# Patient Record
Sex: Male | Born: 1990 | Race: White | Hispanic: No | Marital: Married | Smoking: Never smoker
Health system: Southern US, Community
[De-identification: ages and names within clinical notes are randomized; demographics above are authoritative.]

---
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
## 2011-02-15 ENCOUNTER — Other Ambulatory Visit: Payer: Self-pay | Admitting: *Deleted

## 2011-02-15 DIAGNOSIS — I10 Essential (primary) hypertension: Secondary | ICD-10-CM

## 2011-02-15 DIAGNOSIS — E1065 Type 1 diabetes mellitus with hyperglycemia: Secondary | ICD-10-CM

## 2011-02-15 MED ORDER — LISINOPRIL 5 MG PO TABS: 5.0000 mg | ORAL_TABLET | Freq: Every day | ORAL | Status: DC

## 2011-03-02 ENCOUNTER — Ambulatory Visit: Payer: BC Managed Care – PPO | Admitting: Family Medicine

## 2011-03-04 ENCOUNTER — Ambulatory Visit (HOSPITAL_BASED_OUTPATIENT_CLINIC_OR_DEPARTMENT_OTHER)
Admission: RE | Admit: 2011-03-04 | Discharge: 2011-03-04 | Disposition: A | Discharge status: Home / Self Care | Payer: BC Managed Care – PPO | Source: Ambulatory Visit | Attending: Family Medicine | Admitting: Family Medicine

## 2011-03-04 ENCOUNTER — Encounter: Payer: Self-pay | Admitting: Family Medicine

## 2011-03-04 ENCOUNTER — Ambulatory Visit (INDEPENDENT_AMBULATORY_CARE_PROVIDER_SITE_OTHER): Payer: BC Managed Care – PPO | Admitting: Family Medicine

## 2011-03-04 VITALS — BP 123/74 | HR 55 | Temp 98.1°F | Ht 74.0 in | Wt 182.0 lb

## 2011-03-04 DIAGNOSIS — M545 Low back pain, unspecified: Secondary | ICD-10-CM | POA: Insufficient documentation

## 2011-03-04 DIAGNOSIS — M47817 Spondylosis without myelopathy or radiculopathy, lumbosacral region: Secondary | ICD-10-CM | POA: Insufficient documentation

## 2011-03-04 DIAGNOSIS — M519 Unspecified thoracic, thoracolumbar and lumbosacral intervertebral disc disorder: Secondary | ICD-10-CM

## 2011-03-04 NOTE — Progress Notes (Signed)
Subjective:    Patient ID: Bobby Padilla, male    DOB: 1991-06-24, 20 y.o.   MRN: [id number]  HPI  20 yo M here for low back pain.  Patient has h/o bilateral pars defects diagnosed 2 years ago - had symptoms and signs consistent with acute exacerbation of pain in this area, x-rays confirmed defects but no evidence of anterolisthesis. Had been dealing with the pain for several months after unsuccessful PT for lumbar strain, chiropractic care. He recovered fully after 3 months of rest following pars diagnosis and returned without problems. At that initial visit he had a positive stork test, pain with hip extension on left side but not on right. On 5/20 while jumping up for a header during a soccer game, was pushed backwards and fell on ground. Unsure at which point back started hurting but developed pain in right paraspinal lumbar region. Had to come out of game at that time. Tried to play the next day but had to be pulled after 10 minutes due to pain. No swelling or bruising. Has been able to practice this week without problems but has not tried kicking ball hard. Had pain with quick movements right after injury but with practice this week has not had problems. Playing [employer] level soccer at this time. Has 2 games this coming weekend, 2 the following weekend, then playoffs.  Past Medical History  Diagnosis Date  . Diabetes mellitus     Current Outpatient Prescriptions on File Prior to Visit  Medication Sig Dispense Refill  . lisinopril (PRINIVIL,ZESTRIL) 5 MG tablet Take 1 tablet (5 mg total) by mouth daily.  30 tablet  11    History reviewed. No pertinent past surgical history.  No Known Allergies  History   Social History  . Marital Status: Single    Spouse Name: N/A    Number of Children: N/A  . Years of Education: N/A   Occupational History  . Not on file.   Social History Main Topics  . Smoking status: Never Smoker   . Smokeless tobacco: Not on file  . Alcohol  Use: Not on file  . Drug Use: Not on file  . Sexually Active: Not on file   Other Topics Concern  . Not on file   Social History Narrative  . No narrative on file    Family History  Problem Relation Age of Onset  . Diabetes Maternal Aunt   . Diabetes Maternal Uncle   . Heart attack Neg Hx     BP 123/74  Pulse 55  Temp(Src) 98.1 F (36.7 C) (Oral)  Ht 6\' 2"  (1.88 m)  Wt 182 lb (82.555 kg)  BMI 23.37 kg/m2  Review of Systems See HPI above.    Objective:   Physical Exam  Gen: NAD Back: No gross deformity, swelling, bruising, scoliosis. No focal midline or paraspinal lumbar TTP.  No SI joint, trochanter, piriformis TTP. FROM without pain on full flexion or extension.  No pain with lateral rotation. Negative stork tests bilaterally. Strength 5/5 BLEs Sensation intact to light touch bilaterally Negative SLRs. Negative logroll bilateral hips.     Assessment & Plan:  1. Low back pain - differential lumbar strain vs exacerbation of pars defects.  Has had significant improvement in symptoms over past 10 days and able to play soccer past couple days without problems.  Negative stork tests and no pain on extension on exam.  Compared lateral radiographs to those done in February 2010 and these appear completely unchanged  with respect to anterolisthesis - discussed regular follow-up until complete skeletal maturity (yearly) but he is at low risk of this progressing at this point.  Bone scan could definitively rule out acute on chronic pars injury but do not feel this is necessary at this time given his marked improvement over past 10 days and exam negative for signs of pars injury.  Regardless, advised he not play in soccer games this weekend to allow for more rest.  Flexion based activities only, handout provided with stretches and exercises.  Aleve, icing as needed.  If has acute worsening of symptoms, would then proceed with bone scan.  F/u in 1 month for reevaluation or sooner if  he has problems.

## 2011-03-04 NOTE — Patient Instructions (Addendum)
Your exam is more consistent with a lumbar strain than an exacerbation of your pars defect. I would recommend that you stay out of game activities for this weekend then resume normal practices on Tuesday. Try to avoid extension of your back as much as possible if this is painful. Avoid painful activities as much as possible. Crunches, curl-ups, bicycles 3 sets of 15 once a day. Knee to chest stretch - hold for 3 x 20-30 seconds and do once a day. Aleve 1-2 tabs twice a day with food for pain. Ice low back 15 minutes at a time 3-4 times a day as needed for pain. Soft brace is ok to wear though studies have not shown them to make much of a difference. Follow up with me in 1 month to recheck on your progress.

## 2011-03-22 ENCOUNTER — Encounter: Payer: Self-pay | Admitting: Pediatrics

## 2011-03-22 DIAGNOSIS — E038 Other specified hypothyroidism: Secondary | ICD-10-CM

## 2011-03-22 DIAGNOSIS — E1065 Type 1 diabetes mellitus with hyperglycemia: Secondary | ICD-10-CM

## 2011-03-22 DIAGNOSIS — E039 Hypothyroidism, unspecified: Secondary | ICD-10-CM | POA: Insufficient documentation

## 2011-05-13 ENCOUNTER — Ambulatory Visit: Payer: BC Managed Care – PPO | Admitting: "Endocrinology

## 2011-09-30 ENCOUNTER — Other Ambulatory Visit: Payer: Self-pay | Admitting: "Endocrinology

## 2011-10-04 ENCOUNTER — Telehealth: Payer: Self-pay | Admitting: *Deleted

## 2011-10-04 NOTE — Telephone Encounter (Signed)
See below note.

## 2011-10-07 ENCOUNTER — Ambulatory Visit: Payer: BC Managed Care – PPO | Admitting: "Endocrinology

## 2011-10-14 ENCOUNTER — Other Ambulatory Visit: Payer: Self-pay | Admitting: "Endocrinology

## 2011-11-12 ENCOUNTER — Other Ambulatory Visit: Payer: Self-pay | Admitting: *Deleted

## 2011-12-20 ENCOUNTER — Ambulatory Visit: Payer: BC Managed Care – PPO | Admitting: "Endocrinology

## 2012-02-17 ENCOUNTER — Ambulatory Visit: Payer: BC Managed Care – PPO | Admitting: "Endocrinology

## 2012-03-02 ENCOUNTER — Encounter: Payer: Self-pay | Admitting: "Endocrinology

## 2012-03-02 ENCOUNTER — Ambulatory Visit (INDEPENDENT_AMBULATORY_CARE_PROVIDER_SITE_OTHER): Payer: BC Managed Care – PPO | Admitting: "Endocrinology

## 2012-03-02 VITALS — BP 140/75 | HR 61 | Wt 185.6 lb

## 2012-03-02 DIAGNOSIS — E1169 Type 2 diabetes mellitus with other specified complication: Secondary | ICD-10-CM

## 2012-03-02 DIAGNOSIS — I10 Essential (primary) hypertension: Secondary | ICD-10-CM

## 2012-03-02 DIAGNOSIS — E049 Nontoxic goiter, unspecified: Secondary | ICD-10-CM

## 2012-03-02 DIAGNOSIS — IMO0002 Reserved for concepts with insufficient information to code with codable children: Secondary | ICD-10-CM

## 2012-03-02 DIAGNOSIS — E1065 Type 1 diabetes mellitus with hyperglycemia: Secondary | ICD-10-CM

## 2012-03-02 DIAGNOSIS — E11649 Type 2 diabetes mellitus with hypoglycemia without coma: Secondary | ICD-10-CM

## 2012-03-02 DIAGNOSIS — E063 Autoimmune thyroiditis: Secondary | ICD-10-CM

## 2012-03-02 LAB — LIPID PANEL
Cholesterol: 157 mg/dL (ref 0–200)
Total CHOL/HDL Ratio: 2.8 Ratio
Triglycerides: 53 mg/dL (ref <150)
VLDL: 11 mg/dL (ref 0–40)

## 2012-03-02 LAB — COMPREHENSIVE METABOLIC PANEL
ALT: 16 U/L (ref 0–53)
AST: 16 U/L (ref 0–37)
Alkaline Phosphatase: 81 U/L (ref 39–117)
BUN: 16 mg/dL (ref 6–23)
CO2: 29 mEq/L (ref 19–32)
Calcium: 9.8 mg/dL (ref 8.4–10.5)
Chloride: 100 mEq/L (ref 96–112)
Glucose, Bld: 176 mg/dL — ABNORMAL HIGH (ref 70–99)
Total Protein: 6.9 g/dL (ref 6.0–8.3)

## 2012-03-02 LAB — TSH: TSH: 1.124 u[IU]/mL (ref 0.350–4.500)

## 2012-03-02 LAB — T3, FREE: T3, Free: 3.1 pg/mL (ref 2.3–4.2)

## 2012-03-02 LAB — T4, FREE: Free T4: 1.13 ng/dL (ref 0.80–1.80)

## 2012-03-02 MED ORDER — LISINOPRIL 5 MG PO TABS: 5.0000 mg | ORAL_TABLET | Freq: Every day | ORAL | Status: DC

## 2012-03-02 NOTE — Progress Notes (Signed)
Subjective:  Patient Name: Bobby Padilla Date of Birth: [birth year]  MRN: [id number]  Bobby Padilla  presents to the office today for follow-up of his T1DM,hypoglycemia, goiter, thyroiditis, intermittent hypothyroidism, and hypertension.  HISTORY OF PRESENT ILLNESS:   Bobby Padilla is a 21 y.o. Caucasian young man.  Bobby Padilla was accompanied by his mother.   1. Bobby Padilla was diagnosed with T1DM in July 2004. He had typical polyuria, polydipsia, and weight loss. He was hospitalized at [facility] ([place]). Serum glucose was reportedly 600. He was diagnosed with "borderline DKA". A multiple daily injection (MD) insulin regimen was initiated. He was subsequently converted to an insulin pump about one year later. He was followed at [facility] diabetes [facility] until his first visit with me on 4.06.06. At that visit he was growing well. I noted a goiter, but his TFTs showed that he was mid-range euthyroid. During the past seven years the patient has done well overall, with HbA1c values varying from 7.2-9.7%. Most HbA1c values have been between 7.8-8.6%. He was mildly hypothyroid in 2007, but since it appeared that he was likely having a flare-up of Hashimoto's disease, I chose not to treat him with Synthroid. He has remained euthyroid, without Synthroid treatment, since then. In 2011 I started him on low-dose lisinopril for both mild hypertension and renal protection.  2. The patient's last PSSG visit was on 12/22/10. In the interim, he has been healthy. He discontinued his insulin pump about one year ago due to playing soccer in college. He prefers to remain on injections. His current Lantus dose is 40 units in the evening. He also takes Novolog aspart insulin by Flexpens. His ISF is 1:30 >150. His food dose is 1:8. He sometimes loosely follows his bedtime snack plan. He discontinued his lisinopril about one year ago.   3. Pertinent Review of Systems:  Constitutional: The patient feels "good". He has no  significant complaints. Eyes: Vision is good. There are no significant eye complaints. His last eye was in the spring of 2012. Neck: The patient has no complaints of anterior neck swelling, soreness, tenderness,  pressure, discomfort, or difficulty swallowing.  Heart: Heart rate increases with exercise or other physical activity. The patient has no complaints of palpitations, irregular heat beats, chest pain, or chest pressure. Gastrointestinal: Bowel movents seem normal. The patient has no complaints of excessive hunger, acid reflux, upset stomach, stomach aches or pains, diarrhea, or constipation. Hands: Can text and do video games quite well.  Legs: Muscle mass and strength seem normal. There are no complaints of numbness, tingling, burning, or pain. No edema is noted. Feet: There are no obvious foot problems. There are no complaints of numbness, tingling, burning, or pain. No edema is noted. Hypoglycemia: Occasionally wakes up in the middle of the night with low BGs. He has not routinely been subtracting points of BG after physical activity.  4. BG printout: BGs vary from 47-497. The lowest BG occurred during physical yard work. Since coming from college at the beginning of May he has had a less structured schedule. He missed 4 bedtime BG checks in the past 14 days.   PAST MEDICAL, FAMILY, AND SOCIAL HISTORY:  Past Medical History  Diagnosis Date  . Diabetes mellitus     Family History  Problem Relation Age of Onset  . Diabetes Maternal Aunt   . Diabetes Maternal Uncle   . Heart attack Neg Hx       Allergies as of 03/02/2012  . (No Known Allergies)  1. School, work, and family: He just finished his freshman year at [employer]. 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 Dad's renal transplant has gone well.  2. Activities: He is no longer playing varsity soccer, but does play with the club team at school. He also plays pick-up basketball a lot. 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 3. Smoking, alcohol, or drugs: None 4. Primary Care Provider: Student health.  ROS: There are no other significant problems involving Bobby Padilla's other body systems.   Objective:  Vital Signs:  BP 140/75  Pulse 61  Wt 185 lb 9.6 oz (84.188 kg)   Ht Readings from Last 3 Encounters:  03/04/11 6\' 2"  (1.88 m) (94.91%*)  01/16/09 6' (1.829 m) (88.17%*)   * Growth percentiles are based on CDC 2-20 Years data.  Weight: 185 lbs, 9.6 oz  PHYSICAL EXAM:  Constitutional: The patient appears healthy and well nourished.  Face: The face appears normal.  Eyes: There is no obvious arcus or proptosis. Moisture appears normal. Mouth: The oropharynx and tongue appear normal. Oral moisture is normal. Neck: The neck appears to be visibly normal. No carotid bruits are noted. The thyroid gland is 20+ grams in size. The consistency of the thyroid gland is  normal. The thyroid gland is not tender to palpation. Lungs: The lungs are clear to auscultation. Air movement is good. Heart: Heart rate and rhythm are regular. Heart sounds S1 and S2 are normal. I did not appreciate any pathologic cardiac murmurs. Abdomen: The abdomen is normal in size. Bowel sounds are normal. There is no obvious hepatomegaly, splenomegaly, or other mass effect.  Arms: Muscle size and bulk are normal for age. Hands: There is no obvious tremor. Phalangeal and metacarpophalangeal joints are normal. Palmar muscles are normal. Palmar skin is normal. Palmar moisture is also normal. Legs: Muscles appear normal for age. No edema is present. Feet: Feet are normally formed. Dorsalis pedal pulses are faint 1+. PT pulses are 2+. Neurologic: Strength is normal for age in both the upper and lower extremities. Muscle tone is normal. Sensation to touch is normal in both the legs and feet.   LAB DATA: HbA1c is 7/1%, compared with 7.8% in March 2012.  Recent Results (from the past 504 hour(s))  GLUCOSE, POCT  (MANUAL RESULT ENTRY)   Collection Time   03/02/12 10:02 AM      Component Value Range   POC Glucose 238 (*) 70 - 99 (mg/dl)     Assessment and Plan:   ASSESSMENT:  1. T1DM: The patient is doing better overall, but still has more BG variability than desirable.  2. Hypoglycemia: The nocturnal events are likely due to combination of not checking BG at HS or taking enough HS snack and not subtracting 50-100 points of BG after physical activity during the evening.  3. Hypertension: Patient must resume lisinopril and adjust doses upward as needed.  4. Goiter: His thyroid gland remains mildly enlarged. He has been euthyroid most of the time in the past. 5. Thyroiditis: His thyroiditis is clinically quiescent.   PLAN:  1. Diagnostic: CMP, TFTS, TPO lipid panel, urine protein test 2. Therapeutic: Check BG at HS. Take appropriate HS snack or sliding scale Novolog dose. Will resume lisinopril, 5 mg per day. 3. Patient education: We discussed hypertensive kidney disease, thyroiditis and hypothyroidism, and long-term complications of poorly controlled T1DM. 4. Follow-up: 6 months  Level of Service: This visit lasted in excess  of 40 minutes. More than 50% of the visit was devoted to counseling.  David Stall, MD 03/02/2012 10:07 AM

## 2012-03-02 NOTE — Patient Instructions (Signed)
Followup visit in 6 months, or 3-4 months if available. Please check blood sugars at bedtime. Please take appropriate bedtime snack or NovoLog dose by sliding scale at bedtime.

## 2012-03-03 LAB — THYROID PEROXIDASE ANTIBODY: Thyroperoxidase Ab SerPl-aCnc: 13.9 IU/mL (ref <35.0)

## 2012-03-03 LAB — MICROALBUMIN / CREATININE URINE RATIO
Creatinine, Urine: 209.4 mg/dL
Microalb Creat Ratio: 10.1 mg/g (ref 0.0–30.0)
Microalb, Ur: 2.12 mg/dL — ABNORMAL HIGH (ref 0.00–1.89)

## 2012-05-10 ENCOUNTER — Other Ambulatory Visit: Payer: Self-pay | Admitting: "Endocrinology

## 2012-05-11 ENCOUNTER — Other Ambulatory Visit: Payer: Self-pay | Admitting: "Endocrinology

## 2012-08-14 ENCOUNTER — Other Ambulatory Visit: Payer: Self-pay | Admitting: *Deleted

## 2012-08-14 DIAGNOSIS — E1065 Type 1 diabetes mellitus with hyperglycemia: Secondary | ICD-10-CM

## 2012-08-14 DIAGNOSIS — IMO0002 Reserved for concepts with insufficient information to code with codable children: Secondary | ICD-10-CM

## 2012-09-19 ENCOUNTER — Encounter: Payer: Self-pay | Admitting: "Endocrinology

## 2012-09-19 ENCOUNTER — Ambulatory Visit (INDEPENDENT_AMBULATORY_CARE_PROVIDER_SITE_OTHER): Payer: BC Managed Care – PPO | Admitting: "Endocrinology

## 2012-09-19 VITALS — BP 117/64 | HR 72 | Wt 186.3 lb

## 2012-09-19 DIAGNOSIS — E049 Nontoxic goiter, unspecified: Secondary | ICD-10-CM | POA: Insufficient documentation

## 2012-09-19 DIAGNOSIS — E11649 Type 2 diabetes mellitus with hypoglycemia without coma: Secondary | ICD-10-CM

## 2012-09-19 DIAGNOSIS — I10 Essential (primary) hypertension: Secondary | ICD-10-CM

## 2012-09-19 DIAGNOSIS — E1169 Type 2 diabetes mellitus with other specified complication: Secondary | ICD-10-CM

## 2012-09-19 DIAGNOSIS — E063 Autoimmune thyroiditis: Secondary | ICD-10-CM

## 2012-09-19 DIAGNOSIS — Z23 Encounter for immunization: Secondary | ICD-10-CM

## 2012-09-19 LAB — COMPREHENSIVE METABOLIC PANEL
AST: 25 U/L (ref 0–37)
Albumin: 4.5 g/dL (ref 3.5–5.2)
BUN: 17 mg/dL (ref 6–23)
CO2: 27 mEq/L (ref 19–32)
Potassium: 4.3 mEq/L (ref 3.5–5.3)

## 2012-09-19 LAB — TSH: TSH: 1.634 u[IU]/mL (ref 0.350–4.500)

## 2012-09-19 LAB — LIPID PANEL
HDL: 55 mg/dL (ref >39)
Total CHOL/HDL Ratio: 2.4 Ratio
Triglycerides: 52 mg/dL (ref <150)
VLDL: 10 mg/dL (ref 0–40)

## 2012-09-19 LAB — T4, FREE: Free T4: 1.4 ng/dL (ref 0.80–1.80)

## 2012-09-19 NOTE — Progress Notes (Signed)
Subjective:  Patient Name: Bobby Padilla Date of Birth: 1991-03-11  MRN: [id number]  Bobby Padilla  presents to the office today for follow-up of his T1DM,hypoglycemia, goiter, thyroiditis, intermittent hypothyroidism, and hypertension.  HISTORY OF PRESENT ILLNESS:   Bobby Padilla is a 21 y.o. Caucasian young man.  Bobby Padilla was accompanied by his mother.   1. Bobby Padilla was diagnosed with T1DM in July 2004. He had typical polyuria, polydipsia, and weight loss. He was hospitalized at [facility]. Serum glucose was reportedly 600. He was diagnosed with "borderline DKA". A multiple daily injection (MD) insulin regimen was initiated. He was subsequently converted to an insulin pump about one year later. He was followed at [facility] diabetes clinic until his first visit with me on 01/07/05. At that visit he was growing well. I noted a goiter, but his TFTs showed that he was mid-range euthyroid. During the past seven years the patient has done well overall, with HbA1c values varying from 7.2-9.7%. Most HbA1c values have been between 7.8-8.6%. He was mildly hypothyroid in 2007, but since it appeared that he was likely having a flare-up of Hashimoto's disease, I chose not to treat him with Synthroid. He has remained euthyroid, without Synthroid treatment, since then. In 2011 I started him on low-dose lisinopril for both mild hypertension and renal protection.  2. The patient's last PSSG visit was on 03/02/12. In the interim, he has been healthy. He discontinued his insulin pump about one year ago due to playing soccer in college. He prefers to remain on injections. His current Lantus dose is 40 units in the evening. He also takes Novolog aspart insulin by Flexpens. His ISF is 1:30 >150. His food dose is 1:8. He sometimes loosely follows his bedtime snack plan. He is still taking his lisinopril, 5 mg/day.   3. Pertinent Review of Systems:  Constitutional: The patient feels "good". He has no significant  complaints. Eyes: Vision is good. There are no significant problems. He had an eye exam in the early Summer. There was no evidence of diabetic eye disease. Neck: The patient has no complaints of anterior neck swelling, soreness, tenderness,  pressure, discomfort, or difficulty swallowing.  Heart: Heart rate increases with exercise or other physical activity. The patient has no complaints of palpitations, irregular heat beats, chest pain, or chest pressure. Gastrointestinal: Bowel movents seem normal. The patient has no complaints of excessive hunger, acid reflux, upset stomach, stomach aches or pains, diarrhea, or constipation. Hands: Can text and do video games quite well.  Legs: Muscle mass and strength seem normal. There are no complaints of numbness, tingling, burning, or pain. No edema is noted. Feet: There are no obvious foot problems. There are no complaints of numbness, tingling, burning, or pain. No edema is noted. Hypoglycemia: He has low BGs about 2-3 times per week, usually after physical activity. He sometimes reduces his Novolog dose at the meal prior to activity, but often forgets to do so. He does routinely subtract points of BG after physical activity.  4. BG printout: He checks BGs an average of 2-3 times per day. BGs vary from 61-407. Some lows occur in the early AM upon awakening. Some occur later in the day after exercise.    PAST MEDICAL, FAMILY, AND SOCIAL HISTORY:  Past Medical History  Diagnosis Date  . Diabetes mellitus     Family History  Problem Relation Age of Onset  . Diabetes Maternal Aunt   . Diabetes Maternal Uncle   . Heart attack Neg Hx  Allergies as of 09/19/2012  . (No Known Allergies)    1. School, work, and family: He is midway through his sophomore year in college at [employer].  2. Activities: He played intramural flag football, club soccer, and pick-up basketball a lot. 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 3. Smoking,  alcohol, or drugs: None 4. Primary Care Provider: Student health.  ROS: There are no other significant problems involving Bobby Padilla other body systems.   Objective:  Vital Signs:  BP 117/64  Pulse 72  Wt 186 lb 4.8 oz (84.505 kg)   Ht Readings from Last 3 Encounters:  03/04/11 6\' 2"  (1.88 m) (94.91%*)  01/16/09 6' (1.829 m) (88.17%*)   * Growth percentiles are based on CDC 2-20 Years data.   PHYSICAL EXAM:  Constitutional: The patient appears healthy, trim, muscular, and well nourished.  Face: The face appears normal.  Eyes: There is no obvious arcus or proptosis. Moisture appears normal. Mouth: The oropharynx and tongue appear normal. Oral moisture is normal. Neck: The neck appears to be visibly normal. No carotid bruits are noted. The thyroid gland is 20+ grams in size. The consistency of the thyroid gland is  normal. The thyroid gland is not tender to palpation. Lungs: The lungs are clear to auscultation. Air movement is good. Heart: Heart rate and rhythm are regular. Heart sounds S1 and S2 are normal. I did not appreciate any pathologic cardiac murmurs. Abdomen: The abdomen is normal in size. Bowel sounds are normal. There is no obvious hepatomegaly, splenomegaly, or other mass effect.  Arms: Muscle size and bulk are normal for age. Hands: There is no obvious tremor. Phalangeal and metacarpophalangeal joints are normal. Palmar muscles are normal. Palmar skin is normal. Palmar moisture is also normal. Legs: Muscles appear normal for age. No edema is present. Feet: Feet are normally formed. Dorsalis pedal pulses are faint 1+.  Neurologic: Strength is normal for age in both the upper and lower extremities. Muscle tone is normal. Sensation to touch is normal in both the legs and feet.   LAB DATA: HbA1c is 7.4%, compared with 7.1% at last visit and with 7.8% at the visit prior.   Recent Results (from the past 504 hour(s))  GLUCOSE, POCT (MANUAL RESULT ENTRY)   Collection Time    09/19/12  9:59 AM      Component Value Range   POC Glucose 174 (*) 70 - 99 mg/dl  POCT GLYCOSYLATED HEMOGLOBIN (HGB A1C)   Collection Time   09/19/12  9:59 AM      Component Value Range   Hemoglobin A1C 7.4       Assessment and Plan:   ASSESSMENT:  1. T1DM: The patient is doing better overall, but still has more BG variability than desirable. He sometimes takes better care of his DM than at other times.  2. Hypoglycemia: The nocturnal events are likely due to combination of not checking BG at HS or taking enough HS snack and not subtracting 50-100 points of BG after physical activity during the evening. A one unit reduction in Lantus will help.  3. Hypertension: Patient's BP is normal on lisinopril.  4. Goiter: His thyroid gland remains mildly enlarged. He has been euthyroid most of the time in the past. Labs were drawn this morning.  5. Thyroiditis: His thyroiditis is clinically quiescent.   PLAN:  1. Diagnostic: CMP, TFTS, TPO lipid panel, urine protein test were drawn today.  2. Therapeutic: Reduce Lantus to 39 units. Check  BG at HS. Take appropriate HS snack or sliding scale Novolog dose. Will continue lisinopril, 5 mg per day. We will give him a flu shot today. 3. Patient education: We discussed hypertensive kidney disease, thyroiditis and hypothyroidism, and Bobby-term complications of poorly controlled T1DM. 4. Follow-up:3-6 months 5. Bobby Padilla gives [place] verbal permission to speak with mom and dad about his healthcare issues.   Level of Service: This visit lasted in excess of 40 minutes. More than 50% of the visit was devoted to counseling.  David Stall, MD 09/19/2012 10:10 AM

## 2012-09-19 NOTE — Patient Instructions (Signed)
Follow up visit in 3-6 months. Please reduce the Lantus dose to 39 units as of tonight.

## 2012-11-10 ENCOUNTER — Other Ambulatory Visit: Payer: Self-pay | Admitting: *Deleted

## 2012-11-10 DIAGNOSIS — E1065 Type 1 diabetes mellitus with hyperglycemia: Secondary | ICD-10-CM

## 2012-11-10 MED ORDER — GLUCOSE BLOOD VI STRP: ORAL_STRIP | Status: DC

## 2012-11-30 IMAGING — CR DG LUMBAR SPINE COMPLETE 4+V
5 series · 5 of 5 positions shown · non-contrast
Comparison: 11/13/2008

CLINICAL DATA: Low back pain.  Bilateral L5 spondylolysis.

LUMBAR SPINE - COMPLETE 4+ VIEW

[t l-spine a.p.]
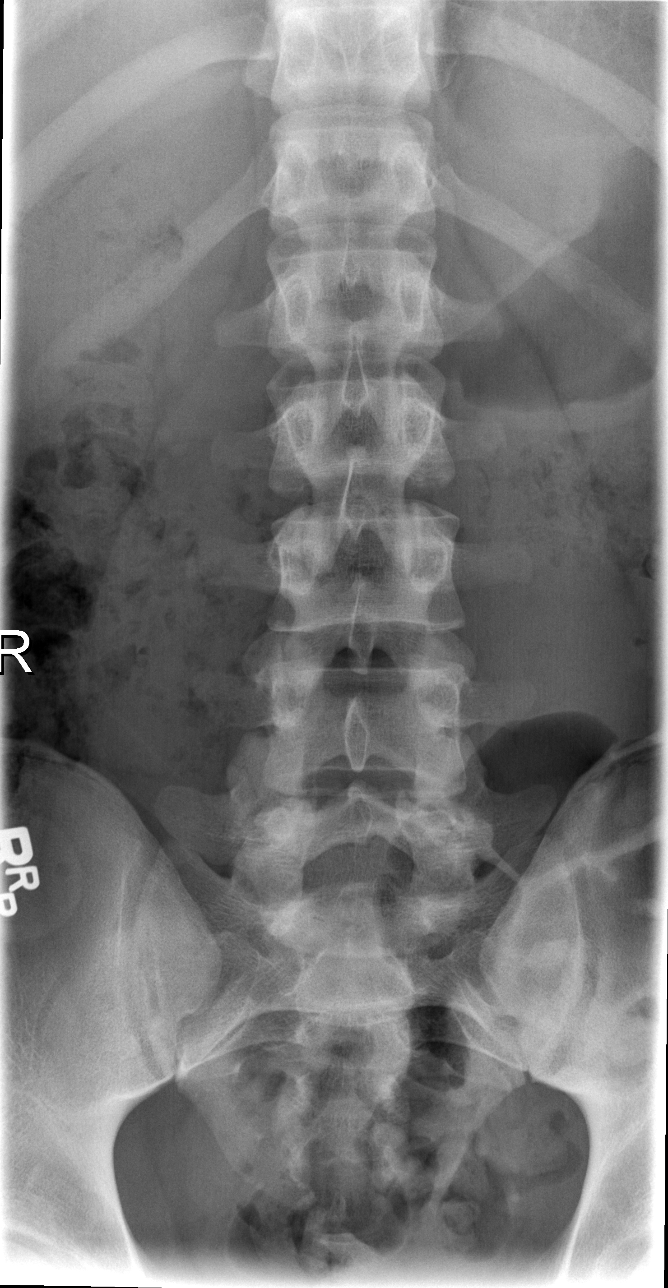

[t l-spine oblique exposure (1 of 2)]
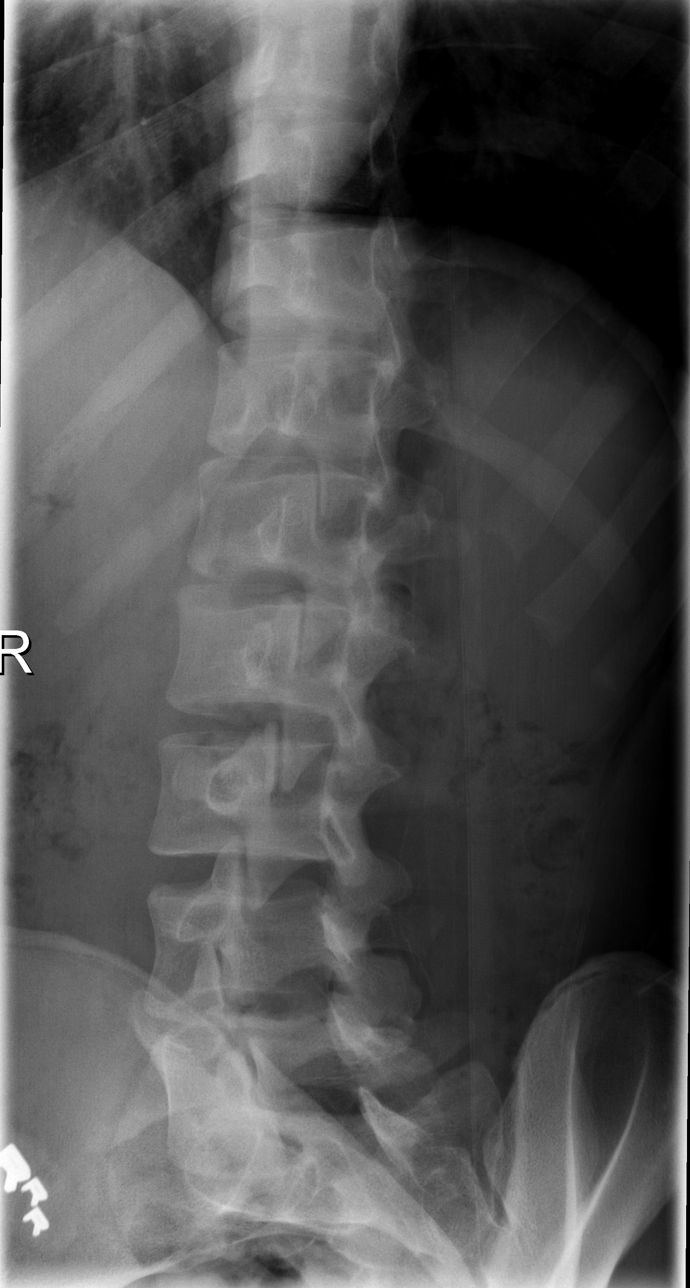

[t l-spine oblique exposure (2 of 2)]
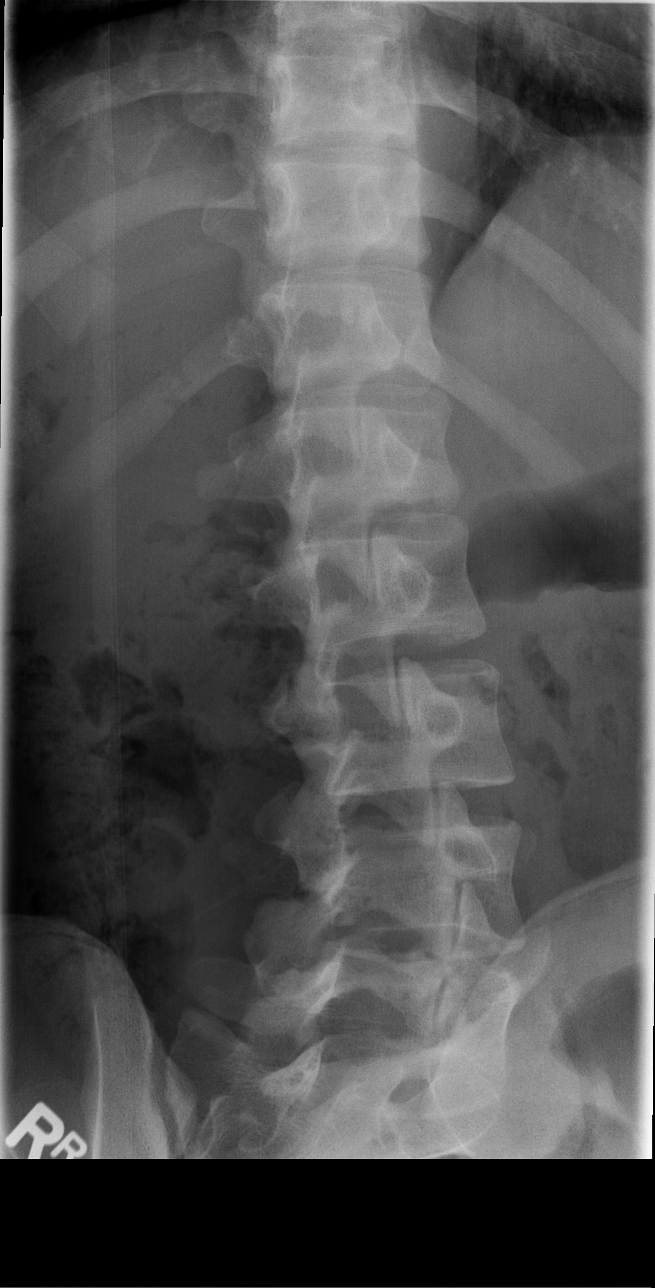

[t l-spine lat]
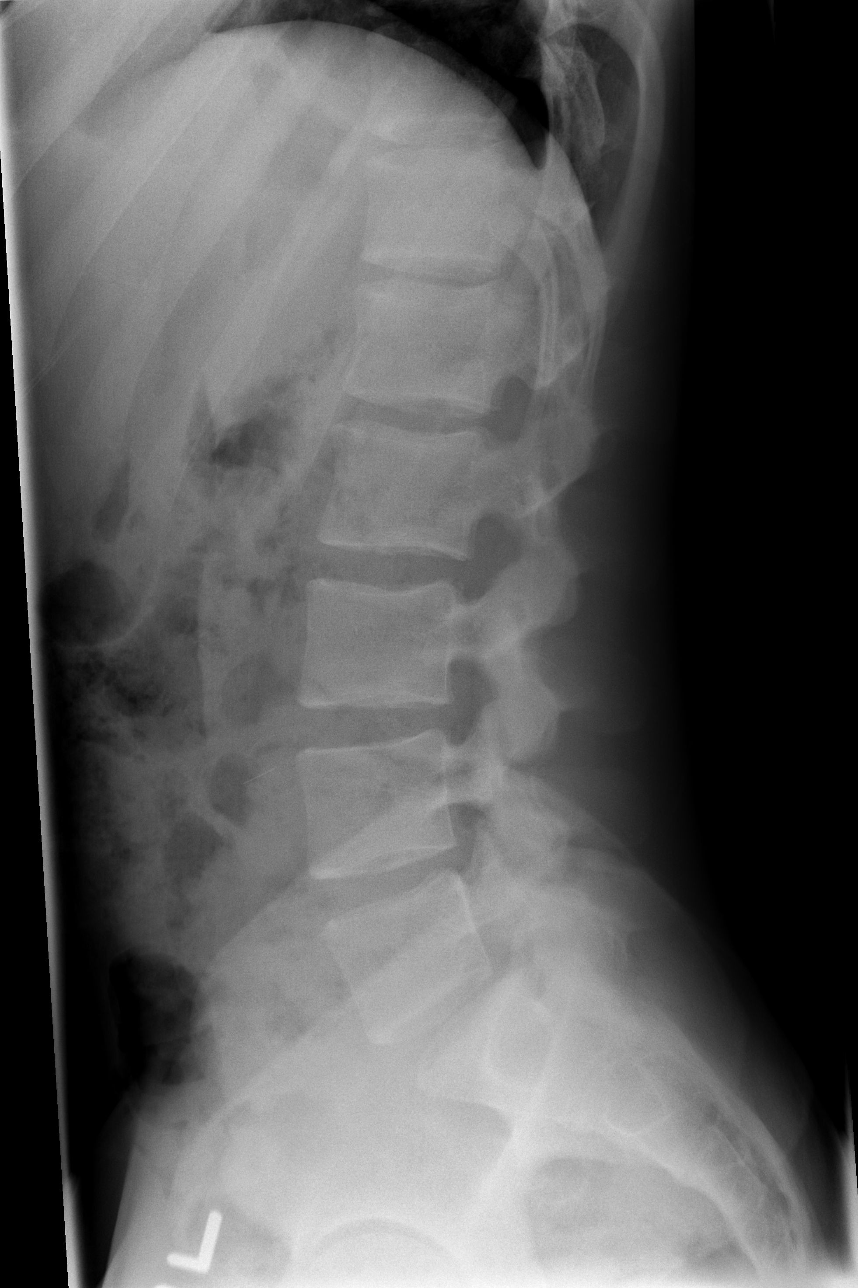

[t l-spine l5-s1 spot]
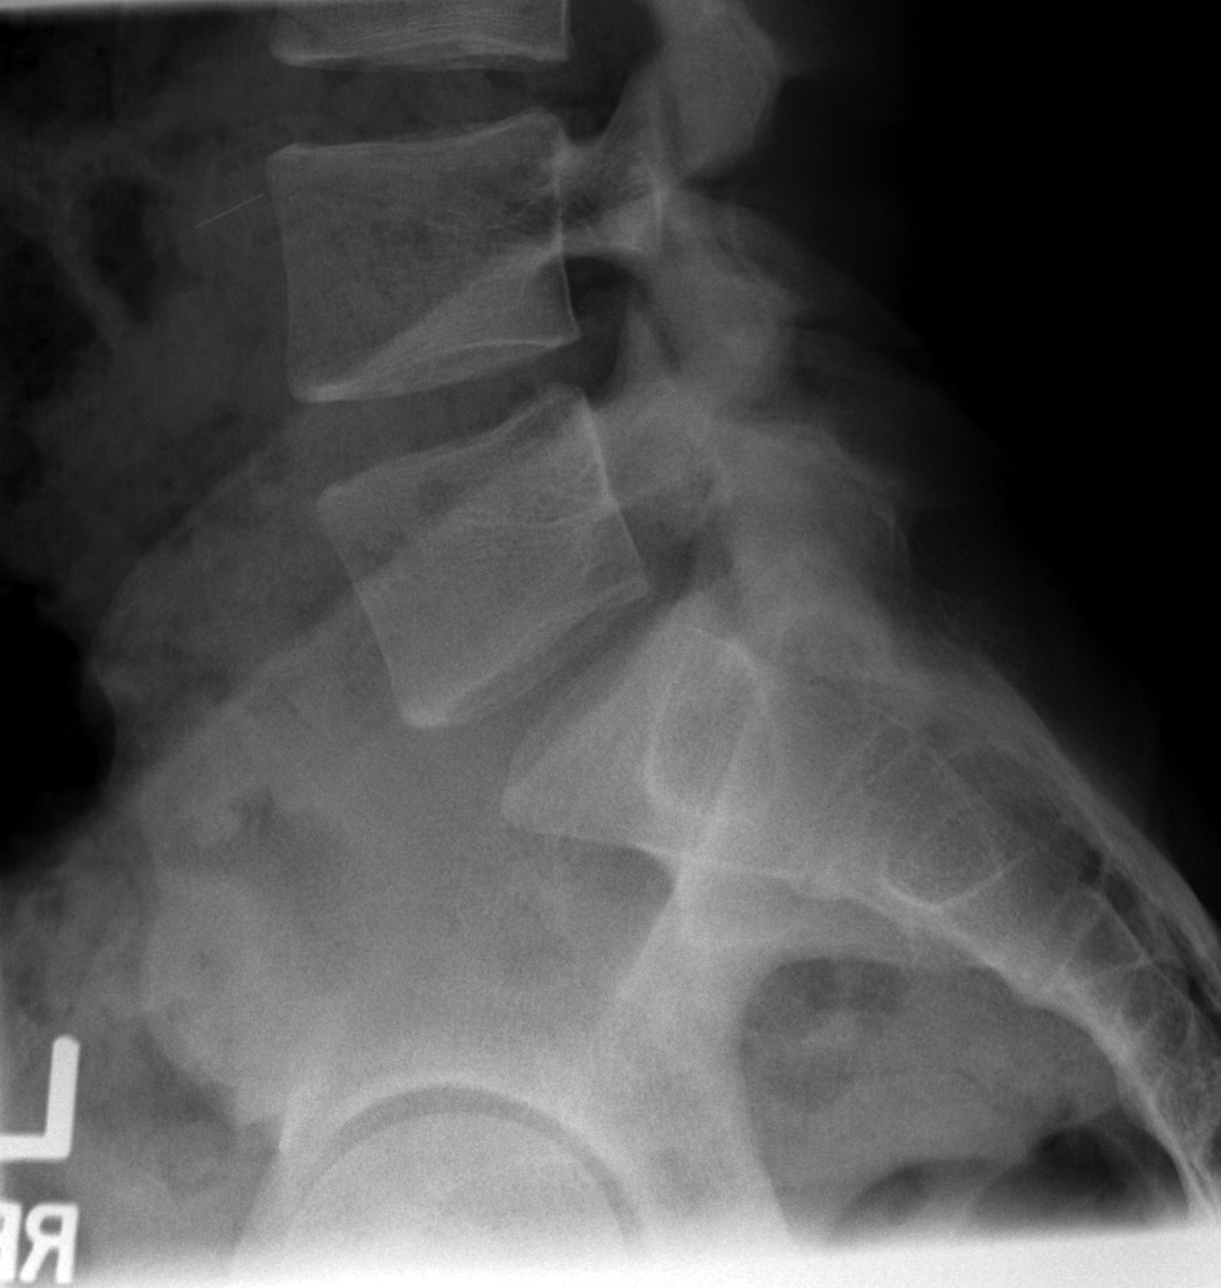

[5 of 5 positions shown; findings below may reference images not displayed]

FINDINGS: Bilateral spondylolysis again seen at L5.  Minimal
anterolisthesis seen at L5-S1 measuring 3 mm.  No evidence of acute
lumbar spine fracture or other bone lesions.  Intervertebral disc
spaces are maintained.  No evidence of facet arthropathy.
IMPRESSION: Bilateral L5 spondylolysis, with grade 1 anterolisthesis at L5-S1
measuring 3 mm.

## 2012-12-15 ENCOUNTER — Other Ambulatory Visit: Payer: Self-pay | Admitting: "Endocrinology

## 2013-01-29 ENCOUNTER — Other Ambulatory Visit: Payer: Self-pay | Admitting: "Endocrinology

## 2013-02-06 ENCOUNTER — Other Ambulatory Visit: Payer: Self-pay | Admitting: *Deleted

## 2013-02-06 DIAGNOSIS — E1065 Type 1 diabetes mellitus with hyperglycemia: Secondary | ICD-10-CM

## 2013-02-06 MED ORDER — GLUCOSE BLOOD VI STRP: ORAL_STRIP | Status: DC

## 2013-03-06 ENCOUNTER — Ambulatory Visit: Payer: BC Managed Care – PPO | Admitting: "Endocrinology

## 2013-03-23 ENCOUNTER — Other Ambulatory Visit: Payer: Self-pay | Admitting: "Endocrinology

## 2013-03-23 DIAGNOSIS — E1065 Type 1 diabetes mellitus with hyperglycemia: Secondary | ICD-10-CM

## 2013-03-23 NOTE — Telephone Encounter (Signed)
Needs to appointment before next refill

## 2013-05-15 ENCOUNTER — Other Ambulatory Visit: Payer: Self-pay | Admitting: "Endocrinology

## 2013-05-17 ENCOUNTER — Other Ambulatory Visit: Payer: Self-pay | Admitting: *Deleted

## 2013-05-17 ENCOUNTER — Ambulatory Visit (INDEPENDENT_AMBULATORY_CARE_PROVIDER_SITE_OTHER): Payer: BC Managed Care – PPO | Admitting: "Endocrinology

## 2013-05-17 ENCOUNTER — Encounter: Payer: Self-pay | Admitting: "Endocrinology

## 2013-05-17 VITALS — BP 113/67 | HR 60 | Wt 183.0 lb

## 2013-05-17 DIAGNOSIS — E049 Nontoxic goiter, unspecified: Secondary | ICD-10-CM

## 2013-05-17 DIAGNOSIS — E063 Autoimmune thyroiditis: Secondary | ICD-10-CM

## 2013-05-17 DIAGNOSIS — E1065 Type 1 diabetes mellitus with hyperglycemia: Secondary | ICD-10-CM

## 2013-05-17 DIAGNOSIS — B353 Tinea pedis: Secondary | ICD-10-CM

## 2013-05-17 DIAGNOSIS — I10 Essential (primary) hypertension: Secondary | ICD-10-CM

## 2013-05-17 DIAGNOSIS — E11649 Type 2 diabetes mellitus with hypoglycemia without coma: Secondary | ICD-10-CM

## 2013-05-17 DIAGNOSIS — E1169 Type 2 diabetes mellitus with other specified complication: Secondary | ICD-10-CM

## 2013-05-17 LAB — GLUCOSE, POCT (MANUAL RESULT ENTRY): POC Glucose: 7.3 mg/dl — AB (ref 70–99)

## 2013-05-17 LAB — POCT GLYCOSYLATED HEMOGLOBIN (HGB A1C): Hemoglobin A1C: 213

## 2013-05-17 MED ORDER — GLUCOSE BLOOD VI STRP: ORAL_STRIP | Status: DC

## 2013-05-17 MED ORDER — KETOCONAZOLE 2 % EX CREA: TOPICAL_CREAM | Freq: Every day | CUTANEOUS | Status: AC

## 2013-05-17 NOTE — Progress Notes (Signed)
Subjective:  Patient Name: Bobby Padilla Date of Birth: Oct 16, 1990  MRN: [id number]  Bobby Padilla  presents to the office today for follow-up of his T1DM, hypoglycemia, goiter, thyroiditis, intermittent hypothyroidism, and hypertension.  HISTORY OF PRESENT ILLNESS:   Bobby Padilla is a 22 y.o. Caucasian young man.  Bobby Padilla was accompanied by his mother.  1. Bobby Padilla was diagnosed with T1DM in July 2004. He had typical polyuria, polydipsia, and weight loss. He was hospitalized at [facility]. Serum glucose was reportedly 600. He was diagnosed with "borderline DKA". A multiple daily injection (MD) insulin regimen was initiated. He was subsequently converted to an insulin pump about one year later. He was followed at [facility] diabetes clinic until his first visit with me on 01/07/05. At that visit he was growing well. I noted a goiter, but his TFTs showed that he was mid-range euthyroid. During the past eight years the patient has done well overall, with HbA1c values varying from 7.2-9.7%. Most HbA1c values had been between 7.8-8.6%, but in the past year the HbA1c values have varied from 7.1-7.4%. He was mildly hypothyroid in 2007, but since it appeared that he was likely having a flare-up of Hashimoto's disease, I chose not to treat him with Synthroid. He has remained euthyroid, without Synthroid treatment, since then. In 2011 I started him on low-dose lisinopril for both mild hypertension and renal protection.  2. The patient's last PSSG visit was on 09/19/12. In the interim, he has been healthy. He discontinued his insulin pump about one year ago due to playing soccer in college. He prefers to remain on injections. His current Lantus dose is 40 units in the evening. He also takes Novolog aspart insulin by Flexpens. His ISF is 1:30 >150. His food dose is 1:8 grams of carbs. He sometimes loosely follows his bedtime snack plan. He is still taking his lisinopril, 5 mg/day, but not consistently.    3. Pertinent Review of Systems:  Constitutional: The patient feels "good". He has no significant complaints. Eyes: Vision is good. There are no significant problems. He had an eye exam in the early Summer 2013. He is due for a FU exam at noon today. There was no evidence of diabetic eye disease on his last exam. Neck: The patient has no complaints of anterior neck swelling, soreness, tenderness,  pressure, discomfort, or difficulty swallowing.  Heart: Heart rate increases with exercise or other physical activity. The patient has no complaints of palpitations, irregular heat beats, chest pain, or chest pressure. Gastrointestinal: Bowel movents seem normal. The patient has no complaints of excessive hunger, acid reflux, upset stomach, stomach aches or pains, diarrhea, or constipation. Hands: Can text and do video games quite well.  Legs: Muscle mass and strength seem normal. There are no complaints of numbness, tingling, burning, or pain. No edema is noted. Feet: There are no obvious foot problems. There are no complaints of numbness, tingling, burning, or pain. No edema is noted. Hypoglycemia: He has low BGs about 2-3 times per week, usually in the mornings or during the middle of the night. He sometimes reduces his Novolog dose at the meal prior to activity, but often forgets to do so. He does not routinely subtract points of BG after physical activity.  4. BG review: He checks BGs 2-4 times per day. BGs vary from 83-438, compared with 61-407 at last visit. The 438 occurred after two dental fillings and Novocaine. Some lows occur in the early AM upon awakening. Some occur late at night or  early in the mornings.     PAST MEDICAL, FAMILY, AND SOCIAL HISTORY:  Past Medical History  Diagnosis Date  . Diabetes mellitus     Family History  Problem Relation Age of Onset  . Diabetes Maternal Aunt   . Diabetes Maternal Uncle   . Heart attack Neg Hx       Allergies as of 05/17/2013  . (No  Known Allergies)    1. School, work, and family: He is about to start the junior year in college at [employer]. He is majoring in recreation and sports management. 2. Activities: He played intramural flag football, club soccer, and pick-up basketball a lot. 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 3. Smoking, alcohol, or drugs: None 4. Primary Care Provider: Student health.  REVIEW OF SYSTEMS: There are no other significant problems involving Bobby Padilla's other body systems.   Objective:  Vital Signs:  BP 113/67  Pulse 60  Wt 183 lb (83.008 kg)  BMI 23.49 kg/m2   Ht Readings from Last 3 Encounters:  03/04/11 6\' 2"  (1.88 m) (95%*, Z = 1.64)  01/16/09 6' (1.829 m) (88%*, Z = 1.18)   * Growth percentiles are based on CDC 2-20 Years data.   PHYSICAL EXAM:  Constitutional: The patient appears healthy, trim, muscular, and well nourished.  Face: The face appears normal.  Eyes: There is no obvious arcus or proptosis. Moisture appears normal. Mouth: The oropharynx and tongue appear normal. Oral moisture is normal. Neck: The neck appears to be visibly normal. No carotid bruits are noted. The strap muscles have enlarged to the point that it is difficult to assess thyroid gland size. The thyroid gland is probably larger, at about 20-25 grams in size. The consistency of the thyroid gland is  normal. The thyroid gland is not tender to palpation. Lungs: The lungs are clear to auscultation. Air movement is good. Heart: Heart rate and rhythm are regular. Heart sounds S1 and S2 are normal. I did not appreciate any pathologic cardiac murmurs. Abdomen: The abdomen is normal in size. Bowel sounds are normal. There is no obvious hepatomegaly, splenomegaly, or other mass effect.  Arms: Muscle size and bulk are normal for age. Hands: There is no obvious tremor. Phalangeal and metacarpophalangeal joints are normal. Palmar muscles are normal. Palmar skin is normal. Palmar moisture is  also normal. Legs: Muscles appear normal for age. No edema is present. Feet: Feet are normally formed. Dorsalis pedal pulses are faint 1+. He has 2+ tinea pedis of his heels.  Neurologic: Strength is normal for age in both the upper and lower extremities. Muscle tone is normal. Sensation to touch is normal in both the legs and feet.   LAB DATA: HbA1c is 7.3% today, compared with 7.4% at last visit and with  7.1% at the visit prior.   Results for orders placed in visit on 05/17/13 (from the past 504 hour(s))  POCT GLYCOSYLATED HEMOGLOBIN (HGB A1C)   Collection Time    05/17/13  9:02 AM      Result Value Range   Hemoglobin A1C 213       Assessment and Plan:   ASSESSMENT:  1. T1DM: The patient's BG review would suggest a higher HbA1c than he has. It is likely that he is having more low BGs that we see in his meter, perhaps more than he recognizes. He still has more BG variability than desirable. During the Summer months his schedule has been more chaotic.  2. Hypoglycemia: The nocturnal events are likely due to combination of not checking BG at HS, or not taking enough HS snack, or not subtracting 50-100 points of BG after physical activity during the evening, or a combination of all of the above.  3. Hypertension: Patient's BP is normal today. He needs to take his lisinopril for renal protection as well as treatment for hypertension..  4. Goiter: His thyroid gland remains mildly enlarged. He has been euthyroid most of the time in the past.  5. Thyroiditis: His thyroiditis is clinically quiescent.   PLAN:  1. Diagnostic: CMP, TFTS, TPO lipid panel, urine microalbumin/creatinine ratio prior to next visit.  Email one week's worth of BGs about one month after stating back to college.  2. Therapeutic: Reduce Lantus to 39 units. Check BG at HS. Take appropriate HS snack or sliding scale Novolog dose. Will continue lisinopril, 5 mg per day. Apply Ketoconazole 2% cream daily until tinea resolves.  . 3. Patient education: We discussed hypertensive kidney disease, thyroiditis and hypothyroidism, and long-term complications of poorly controlled T1DM. 4. Follow-up: Christmas holidays 5. Quamaine gives [place] verbal permission to speak with mom and dad about his healthcare issues.   Level of Service: This visit lasted in excess of 40 minutes. More than 50% of the visit was devoted to counseling.  David Stall, MD 05/17/2013 9:12 AM

## 2013-05-17 NOTE — Patient Instructions (Signed)
Follow up visit during the Christmas break. Please reduce the Lantus dose to 39 units. After you have been back at college for 2-3 weeks, please mail or email in one week's worth of BG values.

## 2013-09-05 ENCOUNTER — Other Ambulatory Visit: Payer: Self-pay | Admitting: *Deleted

## 2013-09-05 DIAGNOSIS — E1065 Type 1 diabetes mellitus with hyperglycemia: Secondary | ICD-10-CM

## 2013-09-20 LAB — LIPID PANEL
Cholesterol: 149 mg/dL (ref 0–200)
HDL: 58 mg/dL (ref >39)
Total CHOL/HDL Ratio: 2.6 Ratio
Triglycerides: 45 mg/dL (ref <150)
VLDL: 9 mg/dL (ref 0–40)

## 2013-09-20 LAB — COMPREHENSIVE METABOLIC PANEL
ALT: 17 U/L (ref 0–53)
CO2: 28 mEq/L (ref 19–32)
Creat: 1 mg/dL (ref 0.50–1.35)
Glucose, Bld: 123 mg/dL — ABNORMAL HIGH (ref 70–99)
Total Bilirubin: 0.8 mg/dL (ref 0.3–1.2)

## 2013-09-20 LAB — T4, FREE: Free T4: 1.32 ng/dL (ref 0.80–1.80)

## 2013-09-20 LAB — T3, FREE: T3, Free: 3.5 pg/mL (ref 2.3–4.2)

## 2013-09-25 ENCOUNTER — Ambulatory Visit (INDEPENDENT_AMBULATORY_CARE_PROVIDER_SITE_OTHER): Payer: BC Managed Care – PPO | Admitting: "Endocrinology

## 2013-09-25 ENCOUNTER — Encounter: Payer: Self-pay | Admitting: "Endocrinology

## 2013-09-25 VITALS — BP 130/83 | HR 75 | Wt 177.2 lb

## 2013-09-25 DIAGNOSIS — M79609 Pain in unspecified limb: Secondary | ICD-10-CM

## 2013-09-25 DIAGNOSIS — E049 Nontoxic goiter, unspecified: Secondary | ICD-10-CM

## 2013-09-25 DIAGNOSIS — E069 Thyroiditis, unspecified: Secondary | ICD-10-CM

## 2013-09-25 DIAGNOSIS — M25579 Pain in unspecified ankle and joints of unspecified foot: Secondary | ICD-10-CM

## 2013-09-25 DIAGNOSIS — M79671 Pain in right foot: Secondary | ICD-10-CM

## 2013-09-25 DIAGNOSIS — E1065 Type 1 diabetes mellitus with hyperglycemia: Secondary | ICD-10-CM

## 2013-09-25 DIAGNOSIS — E11649 Type 2 diabetes mellitus with hypoglycemia without coma: Secondary | ICD-10-CM

## 2013-09-25 DIAGNOSIS — I10 Essential (primary) hypertension: Secondary | ICD-10-CM

## 2013-09-25 DIAGNOSIS — E1169 Type 2 diabetes mellitus with other specified complication: Secondary | ICD-10-CM

## 2013-09-25 DIAGNOSIS — M25571 Pain in right ankle and joints of right foot: Secondary | ICD-10-CM

## 2013-09-25 LAB — POCT GLYCOSYLATED HEMOGLOBIN (HGB A1C): Hemoglobin A1C: 7.9

## 2013-09-25 LAB — GLUCOSE, POCT (MANUAL RESULT ENTRY): POC Glucose: 189 mg/dl — AB (ref 70–99)

## 2013-09-25 NOTE — Progress Notes (Signed)
Subjective:  Patient Name: Bobby Padilla Date of Birth: 1991-03-20  MRN: [id number]  Sukhman Martine  presents to the office today for follow-up of his T1DM, hypoglycemia, goiter, thyroiditis, intermittent hypothyroidism, and hypertension.  HISTORY OF PRESENT ILLNESS:   Ajani is a 22 y.o. Caucasian young man.  Jaymari was unaccompanied.  1. Ihsan was diagnosed with T1DM in July 2004. He had typical polyuria, polydipsia, and weight loss. He was hospitalized at [facility]. Serum glucose was reportedly 600. He was diagnosed with "borderline DKA". A multiple daily injection (MD) insulin regimen was initiated. He was subsequently converted to an insulin pump about one year later. He was followed at [facility] diabetes clinic until his first visit with me on 01/07/05. At that visit he was growing well. I noted a goiter, but his TFTs showed that he was mid-range euthyroid. During the past eight years the patient has done well overall, with HbA1c values varying from 7.2-9.7%. Most HbA1c values had been between 7.8-8.6%, but in the past year the HbA1c values have varied from 7.1-7.4%. He was mildly hypothyroid in 2007, but since it appeared that he was likely having a flare-up of Hashimoto's disease, I chose not to treat him with Synthroid. He has remained euthyroid, without Synthroid treatment, since then. In 2011 I started him on low-dose lisinopril for both mild hypertension and renal protection.  2. The patient's last PSS[facility] visit was on 05/17/13. In the interim, he has been healthy. He discontinued his insulin pump about one year ago due to playing soccer in college. He prefers to remain on injections. His current Lantus dose is 39 units in the evening. He also takes Novolog aspart insulin by Flexpens. His ISF is 1:30 >150. His food dose is 1:8 grams of carbs. He sometimes loosely follows his bedtime snack plan. Some weeks go better than others in terms of BG control. He is still taking his  lisinopril, 5 mg/day, but not consistently. He sometimes feels lightheaded when he takes the lisinopril.  3. Pertinent Review of Systems:  Constitutional: The patient feels "good". He has no significant complaints. Eyes: Vision is good. There are no significant problems. He had an eye exam in August 2014. There was no evidence of diabetic eye disease on his last exam. Neck: The patient has no complaints of anterior neck swelling, soreness, tenderness,  pressure, discomfort, or difficulty swallowing.  Heart: Heart rate increases with exercise or other physical activity. The patient has no complaints of palpitations, irregular heat beats, chest pain, or chest pressure. Gastrointestinal: Bowel movents seem normal. The patient has no complaints of excessive hunger, acid reflux, upset stomach, stomach aches or pains, diarrhea, or constipation. Hands: Can text and do video games quite well.  Legs: Muscle mass and strength seem normal. There are no complaints of numbness, tingling, burning, or pain. No edema is noted. Feet: He has been having some pain at the ball of the right foot. There are no other obvious foot problems. There are no complaints of numbness, tingling, burning, or pain. No edema is noted. Hypoglycemia: He has low BGs about 0-2 times per week, usually at lunch or in the afternoons. He often reduces his Novolog dose at the meal prior to activity, but often forgets to do so. He does not routinely subtract points of BG after physical activity.  4. BG review: He checks BGs 2-4 times per day, although he is missing about 6 days of BG values in the past 4 weeks. He thinks he sometimes uses  another meter of the same type.  BGs vary from 62-601. Some low  BGs occur at lunch or at dinner. His highest BGs are at about 2 AM. He also has the most variability at that time. He frequently misses hs BG checks at dinner, so does not take a Correction Dose of Novolog, resulting in great variability at 2 AM.       PAST MEDICAL, FAMILY, AND SOCIAL HISTORY:  Past Medical History  Diagnosis Date  . Diabetes mellitus     Family History  Problem Relation Age of Onset  . Diabetes Maternal Aunt   . Diabetes Maternal Uncle   . Heart attack Neg Hx       Allergies as of 09/25/2013  . (No Known Allergies)    1. School, work, and family: He is in his junior year in college at [employer]. He is majoring in recreation and sports management. 2. Activities: He has not been very physically active in the past month.  3. Smoking, alcohol, or drugs: None 4. Primary Care Provider: [facility] Health at [facility]  REVIEW OF SYSTEMS: There are no other significant problems involving De's other body systems.   Objective:  Vital Signs:  BP 130/83  Pulse 75  Wt 177 lb 3.2 oz (80.377 kg)   Ht Readings from Last 3 Encounters:  03/04/11 6\' 2"  (1.88 m) (95%*, Z = 1.64)  01/16/09 6' (1.829 m) (88%*, Z = 1.18)   * Growth percentiles are based on CDC 2-20 Years data.   PHYSICAL EXAM:  Constitutional: The patient appears healthy, trim, muscular, and well nourished. He has lost 6 pounds since last visit.  Face: The face appears normal.  Eyes: There is no obvious arcus or proptosis. Moisture appears normal. Mouth: The oropharynx and tongue appear normal. Oral moisture is normal. Neck: The neck appears to be visibly normal. No carotid bruits are noted. The strap muscles have enlarged to the point that it is difficult to assess thyroid gland size. The thyroid gland is probably larger, at about 23-25 grams in size. The consistency of the thyroid gland is  normal. The thyroid gland is not tender to palpation. Lungs: The lungs are clear to auscultation. Air movement is good. Heart: Heart rate and rhythm are regular. Heart sounds S1 and S2 are normal. I did not appreciate any pathologic cardiac murmurs. Abdomen: The abdomen is normal in size. Bowel sounds are normal. There is no obvious hepatomegaly, splenomegaly, or other  mass effect.  Arms: Muscle size and bulk are normal for age. Hands: There is no obvious tremor. Phalangeal and metacarpophalangeal joints are normal. Palmar muscles are normal. Palmar skin is normal. Palmar moisture is also normal. Legs: Muscles appear normal for age. No edema is present. Feet: Feet are normally formed. Dorsalis pedal pulses are faint 1+. He has 1+ tinea pedis of his heels. Passive flexion and extension of his right great MTP joint causes pain at times. Neurologic: Strength is normal for age in both the upper and lower extremities. Muscle tone is normal. Sensation to touch is normal in both the legs and feet.   LAB DATA: HbA1c is 7.95 today, compared with 7.3% at last visit and with  7.4% at the visit prior.   Results for orders placed in visit on 09/25/13 (from the past 504 hour(s))  GLUCOSE, POCT (MANUAL RESULT ENTRY)   Collection Time    09/25/13 10:01 AM      Result Value Range   POC Glucose 189 (*) 70 - 99  mg/dl  POCT GLYCOSYLATED HEMOGLOBIN (HGB A1C)   Collection Time    09/25/13 10:03 AM      Result Value Range   Hemoglobin A1C 7.9    Results for orders placed in visit on 09/05/13 (from the past 504 hour(s))  COMPREHENSIVE METABOLIC PANEL   Collection Time    09/20/13 10:36 AM      Result Value Range   Sodium 141  135 - 145 mEq/L   Potassium 4.7  3.5 - 5.3 mEq/L   Chloride 102  96 - 112 mEq/L   CO2 28  19 - 32 mEq/L   Glucose, Bld 123 (*) 70 - 99 mg/dL   BUN 17  6 - 23 mg/dL   Creat 1.61  0.96 - 0.45 mg/dL   Total Bilirubin 0.8  0.3 - 1.2 mg/dL   Alkaline Phosphatase 55  39 - 117 U/L   AST 16  0 - 37 U/L   ALT 17  0 - 53 U/L   Total Protein 6.9  6.0 - 8.3 g/dL   Albumin 4.7  3.5 - 5.2 g/dL   Calcium 40.9  8.4 - 81.1 mg/dL  LIPID PANEL   Collection Time    09/20/13 10:36 AM      Result Value Range   Cholesterol 149  0 - 200 mg/dL   Triglycerides 45  <914 mg/dL   HDL 58  >78 mg/dL   Total CHOL/HDL Ratio 2.6     VLDL 9  0 - 40 mg/dL   LDL  Cholesterol 82  0 - 99 mg/dL  MICROALBUMIN / CREATININE URINE RATIO   Collection Time    09/20/13 10:36 AM      Result Value Range   Microalb, Ur 4.27 (*) 0.00 - 1.89 mg/dL   Creatinine, Urine 295.6     Microalb Creat Ratio 10.7  0.0 - 30.0 mg/g  TSH   Collection Time    09/20/13 10:36 AM      Result Value Range   TSH 1.625  0.350 - 4.500 uIU/mL  T4, FREE   Collection Time    09/20/13 10:36 AM      Result Value Range   Free T4 1.32  0.80 - 1.80 ng/dL  T3, FREE   Collection Time    09/20/13 10:36 AM      Result Value Range   T3, Free 3.5  2.3 - 4.2 pg/mL  THYROID PEROXIDASE ANTIBODY   Collection Time    09/20/13 10:36 AM      Result Value Range   Thyroid Peroxidase Antibody 13.9  <35.0 IU/mL  Labs 09/20/13: CMP normal; cholesterol 149, triglycerides 45, HDL 58, LDL 82; urinary microalbumin/creatinine ratio 10.7;  TSH 1.625, free T4 1.32, free T3 3.5, TPO antibody 13.9   Assessment and Plan:   ASSESSMENT:  1. T1DM: The patient's BG review would suggest that his HbA1c is about what we see today. He still has more BG variability than desirable, usually due to missed BG checks and/or missed or reduced Novolog doses. Because he's been less physically active, his insulin requirement has increased.  2. Hypoglycemia: He is not having many low BGs.   3. Hypertension: Patient's BP is higher today, in part due to less exercise and in part due to missing many doses of lisinopril. He needs to take his lisinopril for renal protection as well as treatment for hypertension..  4. Goiter: His thyroid gland remains mildly enlarged. He has been euthyroid most of the time in the past  and is euthyroid again this month.   5. Thyroiditis: His thyroiditis is clinically quiescent.  6. Foot pain localized to the right great toe MTP joint: Although his pain is most likely due to sports trauma, it is also possible that he has gout. Dad has gout, but also has renal failure.  PLAN:  1. Diagnostic:  HbA1c  today. Uric acid today. About one month after stating back to college, e-mail one week's worth of BGs to me for review.   2. Therapeutic: Continue Lantus dose of 39 units. Get back on track with BG checks and insulin doses.  Will continue lisinopril, 5 mg per day. Apply Ketoconazole 2% cream daily until tinea resolves.  3. Patient education: We discussed hypertensive kidney disease, thyroiditis and hypothyroidism, and long-term complications of poorly controlled T1DM. 4. Follow-up: Christmas holidays 5. Bishoy gives [place] verbal permission to speak with mom and dad about his healthcare issues.   Level of Service: This visit lasted in excess of 40 minutes. More than 50% of the visit was devoted to counseling.  David Stall, MD 09/25/2013 10:23 AM

## 2013-09-25 NOTE — Patient Instructions (Signed)
Follow up visit in 3 months. E-mail one week's worth of BGs to Dr.  Fransico Yulissa Needham after you return to college.

## 2013-09-26 DIAGNOSIS — M79671 Pain in right foot: Secondary | ICD-10-CM | POA: Insufficient documentation

## 2013-10-01 ENCOUNTER — Encounter: Payer: Self-pay | Admitting: *Deleted

## 2013-10-18 ENCOUNTER — Other Ambulatory Visit: Payer: Self-pay | Admitting: "Endocrinology

## 2013-12-31 ENCOUNTER — Ambulatory Visit: Payer: BC Managed Care – PPO | Admitting: "Endocrinology

## 2014-02-05 ENCOUNTER — Other Ambulatory Visit: Payer: Self-pay | Admitting: "Endocrinology

## 2014-04-19 ENCOUNTER — Other Ambulatory Visit: Payer: Self-pay | Admitting: *Deleted

## 2014-04-19 DIAGNOSIS — IMO0002 Reserved for concepts with insufficient information to code with codable children: Secondary | ICD-10-CM

## 2014-04-19 DIAGNOSIS — E1065 Type 1 diabetes mellitus with hyperglycemia: Secondary | ICD-10-CM

## 2014-04-19 MED ORDER — "INSULIN SYRINGE-NEEDLE U-100 31G X 5/16"" 0.3 ML MISC": Status: DC

## 2014-11-08 ENCOUNTER — Other Ambulatory Visit: Payer: Self-pay | Admitting: "Endocrinology

## 2014-11-11 ENCOUNTER — Telehealth: Payer: Self-pay | Admitting: "Endocrinology

## 2014-11-11 ENCOUNTER — Other Ambulatory Visit: Payer: Self-pay | Admitting: "Endocrinology

## 2014-11-11 ENCOUNTER — Other Ambulatory Visit: Payer: Self-pay | Admitting: *Deleted

## 2014-11-11 DIAGNOSIS — IMO0002 Reserved for concepts with insufficient information to code with codable children: Secondary | ICD-10-CM

## 2014-11-11 DIAGNOSIS — E1065 Type 1 diabetes mellitus with hyperglycemia: Secondary | ICD-10-CM

## 2014-11-11 MED ORDER — INSULIN ASPART 100 UNIT/ML FLEXPEN: PEN_INJECTOR | SUBCUTANEOUS | Status: DC

## 2014-11-11 MED ORDER — GLUCOSE BLOOD VI STRP: ORAL_STRIP | Status: DC

## 2014-11-11 NOTE — Telephone Encounter (Signed)
Sent via escribe. KW 

## 2014-11-25 ENCOUNTER — Ambulatory Visit: Payer: Self-pay | Admitting: "Endocrinology

## 2014-12-09 ENCOUNTER — Other Ambulatory Visit: Payer: Self-pay | Admitting: *Deleted

## 2014-12-09 DIAGNOSIS — E1065 Type 1 diabetes mellitus with hyperglycemia: Secondary | ICD-10-CM

## 2014-12-09 DIAGNOSIS — IMO0002 Reserved for concepts with insufficient information to code with codable children: Secondary | ICD-10-CM

## 2015-01-06 ENCOUNTER — Ambulatory Visit: Payer: Self-pay | Admitting: "Endocrinology

## 2015-01-24 ENCOUNTER — Other Ambulatory Visit: Payer: Self-pay | Admitting: "Endocrinology

## 2015-06-03 ENCOUNTER — Telehealth: Payer: Self-pay | Admitting: "Endocrinology

## 2015-06-03 NOTE — Telephone Encounter (Signed)
Sent via EPIC 

## 2015-07-14 ENCOUNTER — Ambulatory Visit: Payer: Self-pay | Admitting: Endocrinology

## 2015-07-14 ENCOUNTER — Telehealth: Payer: Self-pay | Admitting: Endocrinology

## 2015-07-14 NOTE — Telephone Encounter (Signed)
Patient Name: Bobby Padilla Gender: Male DOB: 08/27/1981 Age: 24 Y 10 M 15 D Return Phone Number: Address: City/State/Zip: [place] [facility] - Client Client Site [facility] Contact Type Call Caller Name Liban Guedes Caller Phone Number [phone] Relationship To Patient Self Is this call to report lab results? No Call Type General Information Initial Comment Caller states wanting to change his appt, asking for office to give him a call General Information Type Message Only Nurse Assessment Guidelines Guideline Title Affirmed Question Affirmed Notes Nurse Date/Time Lamount Cohen.   I will call him and reschedule appt

## 2015-07-14 NOTE — Telephone Encounter (Signed)
Patient has been reschedule his new patient appointment

## 2015-07-14 NOTE — Telephone Encounter (Signed)
Yes I will

## 2015-07-14 NOTE — Telephone Encounter (Signed)
Bobby Padilla will you contact this patient to reschedule his appointment. Thanks!

## 2015-08-05 ENCOUNTER — Encounter: Payer: Self-pay | Admitting: Internal Medicine

## 2015-08-05 ENCOUNTER — Other Ambulatory Visit (INDEPENDENT_AMBULATORY_CARE_PROVIDER_SITE_OTHER): Payer: BLUE CROSS/BLUE SHIELD | Admitting: *Deleted

## 2015-08-05 ENCOUNTER — Ambulatory Visit (INDEPENDENT_AMBULATORY_CARE_PROVIDER_SITE_OTHER): Payer: BLUE CROSS/BLUE SHIELD | Admitting: Internal Medicine

## 2015-08-05 VITALS — BP 108/68 | HR 73 | Temp 98.2°F | Resp 12 | Wt 185.8 lb

## 2015-08-05 DIAGNOSIS — Z23 Encounter for immunization: Secondary | ICD-10-CM

## 2015-08-05 DIAGNOSIS — E1065 Type 1 diabetes mellitus with hyperglycemia: Principal | ICD-10-CM

## 2015-08-05 DIAGNOSIS — R740 Nonspecific elevation of levels of transaminase and lactic acid dehydrogenase [LDH]: Secondary | ICD-10-CM

## 2015-08-05 DIAGNOSIS — R7401 Elevation of levels of liver transaminase levels: Secondary | ICD-10-CM

## 2015-08-05 DIAGNOSIS — E109 Type 1 diabetes mellitus without complications: Secondary | ICD-10-CM | POA: Diagnosis not present

## 2015-08-05 DIAGNOSIS — IMO0001 Reserved for inherently not codable concepts without codable children: Secondary | ICD-10-CM

## 2015-08-05 LAB — HEPATIC FUNCTION PANEL
ALBUMIN: 4.4 g/dL (ref 3.5–5.2)
ALT: 14 U/L (ref 0–53)
AST: 15 U/L (ref 0–37)
Alkaline Phosphatase: 54 U/L (ref 39–117)
Bilirubin, Direct: 0.1 mg/dL (ref 0.0–0.3)
Total Bilirubin: 0.6 mg/dL (ref 0.2–1.2)
Total Protein: 7 g/dL (ref 6.0–8.3)

## 2015-08-05 LAB — TSH: TSH: 1.02 u[IU]/mL (ref 0.35–4.50)

## 2015-08-05 MED ORDER — ACCU-CHEK FASTCLIX LANCETS MISC: Status: DC

## 2015-08-05 MED ORDER — GLUCOSE BLOOD VI STRP: ORAL_STRIP | Status: DC

## 2015-08-05 MED ORDER — ACCU-CHEK AVIVA PLUS W/DEVICE KIT: PACK | Status: DC

## 2015-08-05 MED ORDER — INSULIN GLARGINE 100 UNIT/ML SOLOSTAR PEN: PEN_INJECTOR | SUBCUTANEOUS | Status: DC

## 2015-08-05 NOTE — Patient Instructions (Addendum)
Please change the insulin doses as follows: - decrease Lantus from 40 to 35 units at bedtime - increase Novolog with meals: ICR 1:10 >> 1:8 ISF 30 >> 40 Target 130  If you have to do a correction at night, please subtract 2 units from the calculated dose.  Please try to bolus 15 minutes before every meal.  Please stop at the lab.  Please come back for a follow-up appointment in 2 months.  Basic Rules for Patients with Type I Diabetes Mellitus  1. The American Diabetes Association (ADA) recommended targets: - fasting sugar <130 - after meal sugar <180 - HbA1C <7%  2. Engage in ?150 min moderate exercise per week  3. Make sure you have ?8h of sleep every night as this helps both blood sugars and your weight.  4. Always keep a sugar log (not only record in your meter) and bring it to all appointments with us.  5. "15-15 rule" for hypoglycemia: if sugars are low, take 15 g of carbs** ("fast sugar" - e.g. 4 glucose tablets, 4 oz orange juice), wait 15 min, then check sugars again. If still <80, repeat. Continue  until your sugars >80, then eat a normal meal.   6. Teach family members and coworkers to inject glucagon. Have a glucagon set at home and one at work. They should call 911 after using the set.  7. Check sugar before driving. If <100, correct, and only start driving if sugars rise ?742100. Check sugar every hour when on a long drive.  8. Check sugar before exercising. If <100, correct, and only start exercising if sugars rise ?100. Check sugar every hour when on a long exercise routine and 1h after you finished exercising.   If >250, check urine for ketones. If you have moderate-large ketones in urine, do not start exercise. Hydrate yourself with clear liquids and correct the high sugar. Recheck sugars and ketones before attempting to exercise.  Be aware that you might need less insulin when exercising.  *intense, short, exercise bursts can increase your sugars, but  *less  intense, longer (>1h), exercise routines can decrease your sugars.   9. Make sure you have a MedAlert bracelet or pendant mentioning "Type I Diabetes Mellitus". If you have a prior episode of severe hypoglycemia or hypoglycemia unawareness, it should also mention this.  10. Please do not walk barefoot. Inspect your feet for sores/cuts and let us know if you have them.   **E.g. of "fast carbs": ? first choice (15 g):  1 tube glucose gel, GlucoPouch 15, 2 oz glucose liquid ? second choice (15-16 g):  3 or 4 glucose tablets (best taken  with water), 15 Dextrose Bits chewable ? third choice (15-20 g):   cup fruit juice,  cup regular soda, 1 cup skim milk,  1 cup sports drink ? fourth choice (15-20 g):  1 small tube Cakemate gel (not frosting), 2 tbsp raisins, 1 tbsp table sugar,  candy, jelly beans, gum drops - check package for carb amount   (adapted from: Juluis RainierMcCall A.L. "Insulin therapy and hypoglycemia" Endocrinol Metab Clin N Am 2012, 41: 57-87)

## 2015-08-05 NOTE — Progress Notes (Addendum)
Patient ID: Bobby Padilla, male   DOB: 07-Mar-1991, 24 y.o.   MRN: [id number]  HPI: Bobby Padilla is a 24 y.o.-year-old male, referred by his PCP, Noelle Redmon, PA, for management of DM1, uncontrolled, without complications. He was previously seen by Dr. Tobe Sos.  Patient has been diagnosed with diabetes in 2004; he started on insulin at dx.   Last hemoglobin A1c was: 06/24/2015: HbA1c 7.6% Lab Results  Component Value Date   HGBA1C 7.9 09/25/2013   HGBA1C 213 05/17/2013   HGBA1C 7.4 09/19/2012   Patient is on the regimen of: - Lantus 40 units at bedtime - NovoLog ICR 1:(8)10; ISF 30; target 140  He was on a pump before, but he was very active and could not keep the pump attached. He had a Cosmo pump first, then a Medtronic pump.   Meter: One Touch Verio  Pt checks his sugars ~4x a day and they are: - am: 81-185, 220 - 2h after b'fast: 89-199, 346 - before lunch: 85-267 - 2h after lunch: 318 - before dinner: 82-280, 389 - 2h after dinner: 154-402 - bedtime: See above - nighttime: 128-411 No lows. Lowest sugar was 50 - very rarely; he has hypoglycemia awareness at 70-80. No previous hypoglycemia admission. He has a glucagon kit at home. He never had to use it. Highest sugar was 400-500 (seldom). No previous DKA admissions.    Pt's meals are: - Breakfast: [place] Yoghurt + coffee + cream - boluses ~2-3 units - Lunch: sandwich + chips or 1 apple - boluses ~ 5-6 units - Dinner: meat + starch + veggie - boluses ~ 6-9 units - Snacks: 1-2 - boluses if >15 g carbs  - no CKD, last BUN/creatinine:  06/24/2015: BUN/Cr creatinine 16/0.96 Lab Results  Component Value Date   BUN 17 09/20/2013   CREATININE 1.00 09/20/2013   06/24/2015: ACR 8.4 - last set of lipids: Lab Results  Component Value Date   CHOL 149 09/20/2013   HDL 58 09/20/2013   LDLCALC 82 09/20/2013   TRIG 45 09/20/2013   CHOLHDL 2.6 09/20/2013   of note, at last check on 06/24/2015: He had significant  transaminitis, with an AST of 220 and an ALT of 110.  - last eye exam was in 2014. No DR.  - no numbness and tingling in his feet.  Patient also has a history of hypothyroidism, however his thyroid tests have normalized so he does not need levothyroxine. I reviewed his thyroid antibodies and they were normal.  Last TSH: Lab Results  Component Value Date   TSH 1.625 09/20/2013   Component     Latest Ref Rng 03/02/2012 09/19/2012 09/20/2013  Thyroperoxidase Ab SerPl-aCnc     <35.0 IU/mL 13.9 16.5 13.9   Pt has FH of DM in maternal aunt and paternal uncle.  ROS: Constitutional: no weight gain/loss, no fatigue, no subjective hyperthermia/hypothermia Eyes: no blurry vision, no xerophthalmia ENT: no sore throat, no nodules palpated in throat, no dysphagia/odynophagia, no hoarseness Cardiovascular: no CP/SOB/palpitations/leg swelling Respiratory: + cough/SOB Gastrointestinal: no N/V/D/C/+ heartburn Musculoskeletal: no muscle/joint aches Skin: no rashes Neurological: no tremors/numbness/tingling/dizziness, + HA - with aura, once a year Psychiatric: no depression/anxiety  Past Medical History  Diagnosis Date  . Diabetes mellitus    No past surgical history on file. Social History   Social History  . Marital Status: Single    Spouse Name: N/A  . Number of Children: 0  . Years of Education: N/A   Occupational History  .  unemployed    Social History Main Topics  . Smoking status: Never Smoker   . Smokeless tobacco: Never Used  . Alcohol Use: No  . Drug Use: No   Current Outpatient Prescriptions on File Prior to Visit  Medication Sig Dispense Refill  . GLUCAGON EMERGENCY 1 MG injection USE AS DIRECTED FOR SEVERE HYPOGLYCEMIA 2 kit 0  . glucose blood (ONETOUCH VERIO) test strip Check blood sugar10 x daily 300 each 1  . insulin aspart (NOVOLOG FLEXPEN) 100 UNIT/ML FlexPen Use up to 50 units daily 5 pen 1  . Insulin Syringe-Needle U-100 (B-D INSULIN SYRINGE) 31G X 5/16" 0.3  ML MISC Use with insulin 6 shots per day 250 each 2  . LANTUS SOLOSTAR 100 UNIT/ML Solostar Pen INJECT UP TO 50 UNITS EVERY EVENING (Patient taking differently: INJECT UP TO 40 UNITS AT BEDTIME) 15 mL 6   No current facility-administered medications on file prior to visit.   No Known Allergies Family History  Problem Relation Age of Onset  . Diabetes Maternal Aunt   . Diabetes Maternal Uncle   . Heart attack Neg Hx    PE: BP 108/68 mmHg  Pulse 73  Temp(Src) 98.2 F (36.8 C) (Oral)  Resp 12  Wt 185 lb 12.8 oz (84.278 kg)  SpO2 97% Body mass index is 23.85 kg/(m^2). Wt Readings from Last 3 Encounters:  08/05/15 185 lb 12.8 oz (84.278 kg)  09/25/13 177 lb 3.2 oz (80.377 kg)  05/17/13 183 lb (83.008 kg)   Constitutional: normal weight, in NAD Eyes: PERRLA, EOMI, no exophthalmos ENT: moist mucous membranes, no thyromegaly, no cervical lymphadenopathy Cardiovascular: RRR, No MRG Respiratory: CTA B Gastrointestinal: abdomen soft, NT, ND, BS+ Musculoskeletal: no deformities, strength intact in all 4 Skin: moist, warm, no rashes Neurological: no tremor with outstretched hands, DTR normal in all 4  ASSESSMENT: 1. DM1, uncontrolled, without complications  2. H/o hypothyroidism  3. Transaminitis  PLAN:  1. Patient with long-standing, uncontrolled DM1, on basal-bolus insulin therapy. An insulin pump in the past, however, this kept coming off as he was doing a lot of sports at that time. He is now exercising 3-4 times a week. He is not opposed an insulin pump, both like to think more about it. For now, will continue with Lantus and NovoLog. - We discussed about his insulin regimen, the fact that he is on the large dose of basal insulin (40 units a day) compared to his bolusing (up to 25 units a day). This is conducive to preprandial hypoglycemia and the need for frequent snacks and also overnight hypoglycemia and an abrupt decreasing sugars from bedtime to morning. He is also correcting  his bedtime sugars based on a regular sliding scale and can take up to 8 units depending on the sugars. - At this visit, I advised him to decrease Lantus insulin to avoid the need for a snack at bedtime, which he admits he does even if sugars are in the 170s, to avoid hypoglycemia in the morning. - I also advised him to decrease his insulin to carb ratio so he gets more insulin with his meals. - I strongly advised him not to forget to bolus with every meal - I also increased his insulin sensitivity factor, since he can develop hypoglycemia after correction of a high blood sugar - for corrections at night, I advised him not to do so unless his sugars are in the 200s, and then to subtract 2 units from the estimated dose - I also recommended  to get an Accu-Chek expert glucometer which can cause delayed his insulin doses based on his sliding scale and the total number of carbs used at a meal -sent prescription to his pharmacy. - We discussed about changes to his insulin regimen, as follows:  Patient Instructions  Please change the insulin doses as follows: - decrease Lantus from 40 to 35 units at bedtime - increase Novolog with meals: ICR 1:10 >> 1:8 ISF 30 >> 40 Target 130  If you have to do a correction at night, please subtract 2 units from the calculated dose.  Please try to bolus 15 minutes before every meal.  Please stop at the lab.  Please come back for a follow-up appointment in 2 months.  - continue checking sugars at different times of the day - check at least 4 times a day, rotating checks - given sugar log and advised how to fill it and to bring it at next appt  - given foot care handout and explained the principles  - given instructions for hypoglycemia management "15-15 rule"  - advised for yearly eye exams >> needs to schedule one - Given flu shot today  - advised to get ketone strips - advised to always have Glu tablets with him - advised for a Med-alert bracelet  mentioning "type 1 diabetes mellitus". - given instruction Re: exercising and driving in DM1 (pt instructions) - no signs of other autoimmune disorders - Return to clinic in 2 mo with sugar log. Will check HbA1c then.  2. H/o hypothyroidism - not on LT4, with normal TFTs in 2014 - no recent TFTs available for review. - will check TSH today  3. Transaminitis - he had elevated AST/ALT at last visit with PCP - denies alcohol use before that test - no h/o HL - will recheck LFTs today and send them to PCP  - time spent with the patient: 1 hour, of which >50% was spent in obtaining information about hisdisease, reviewing previous labs, office visit notes, and DM treatments, counseling pt about his condition (please see the discussed topics above), and developing a plan to prevent further hypoglycemia and hyperglycemia.Pt had a number of questions which I addressed.  Office Visit on 08/05/2015  Component Date Value Ref Range Status  . Total Bilirubin 08/05/2015 0.6  0.2 - 1.2 mg/dL Final  . Bilirubin, Direct 08/05/2015 0.1  0.0 - 0.3 mg/dL Final  . Alkaline Phosphatase 08/05/2015 54  39 - 117 U/L Final  . AST 08/05/2015 15  0 - 37 U/L Final  . ALT 08/05/2015 14  0 - 53 U/L Final  . Total Protein 08/05/2015 7.0  6.0 - 8.3 g/dL Final  . Albumin 08/05/2015 4.4  3.5 - 5.2 g/dL Final  . TSH 08/05/2015 1.02  0.35 - 4.50 uIU/mL Final   Thyroid and liver tests are perfect.

## 2015-10-09 ENCOUNTER — Ambulatory Visit: Payer: BLUE CROSS/BLUE SHIELD | Admitting: Internal Medicine

## 2015-10-28 ENCOUNTER — Telehealth: Payer: Self-pay | Admitting: *Deleted

## 2015-10-28 NOTE — Telephone Encounter (Signed)
This came in via MyChart. Can you be of help with this? I am having trouble finding a 30 min slot for him. He is a Type 1 on a pump.   Appointment Request From: [name]     With Provider: Carlus Pavlov, MD [[facility]]    Preferred Date Range: From 11/10/2015 To 12/01/2015    Reason for visit: Office Visit    Comments:  I would like to schedule an appointment on either a Monday or Thursday no later than 10:00 AM. Thank you

## 2015-10-29 NOTE — Telephone Encounter (Signed)
LM for pt to call me when he can to schedule

## 2015-11-14 ENCOUNTER — Other Ambulatory Visit: Payer: Self-pay | Admitting: *Deleted

## 2015-11-14 MED ORDER — GLUCOSE BLOOD VI STRP: ORAL_STRIP | Status: DC

## 2015-11-14 MED ORDER — BAYER MICROLET LANCETS MISC: Status: DC

## 2015-11-14 MED ORDER — BAYER CONTOUR NEXT MONITOR W/DEVICE KIT: PACK | Status: DC

## 2015-11-14 NOTE — Telephone Encounter (Signed)
Ins does not cover Accu-chek [employer] Plus testing supplies. Switching to [employer].

## 2015-11-17 ENCOUNTER — Other Ambulatory Visit: Payer: Self-pay | Admitting: *Deleted

## 2015-11-17 NOTE — Telephone Encounter (Signed)
Ins does not cover Accu-chek [employer] meter/strips. Sending [employer].

## 2015-11-21 ENCOUNTER — Encounter: Payer: Self-pay | Admitting: Internal Medicine

## 2015-11-21 ENCOUNTER — Other Ambulatory Visit (INDEPENDENT_AMBULATORY_CARE_PROVIDER_SITE_OTHER): Payer: BLUE CROSS/BLUE SHIELD | Admitting: *Deleted

## 2015-11-21 ENCOUNTER — Ambulatory Visit (INDEPENDENT_AMBULATORY_CARE_PROVIDER_SITE_OTHER): Payer: BLUE CROSS/BLUE SHIELD | Admitting: Internal Medicine

## 2015-11-21 VITALS — BP 110/68 | HR 67 | Temp 98.3°F | Resp 12 | Wt 190.8 lb

## 2015-11-21 DIAGNOSIS — E109 Type 1 diabetes mellitus without complications: Secondary | ICD-10-CM | POA: Diagnosis not present

## 2015-11-21 DIAGNOSIS — E1065 Type 1 diabetes mellitus with hyperglycemia: Principal | ICD-10-CM

## 2015-11-21 DIAGNOSIS — IMO0001 Reserved for inherently not codable concepts without codable children: Secondary | ICD-10-CM | POA: Insufficient documentation

## 2015-11-21 LAB — POCT GLYCOSYLATED HEMOGLOBIN (HGB A1C): HEMOGLOBIN A1C: 7.5

## 2015-11-21 MED ORDER — INSULIN GLARGINE 100 UNIT/ML SOLOSTAR PEN: PEN_INJECTOR | SUBCUTANEOUS | Status: DC

## 2015-11-21 NOTE — Patient Instructions (Signed)
Please split Lantus: - 15 units in am and 20 units at bedtime  Continue: - Novolog with meals: ICR 1:8 ISF 30  Target 130 >> 120  If you have to do a correction at night, please subtract 2 units from the calculated dose.  Please come back for a follow-up appointment in 3 months (in am).

## 2015-11-21 NOTE — Progress Notes (Signed)
Patient ID: Bobby Padilla, male   DOB: May 27, 1991, 25 y.o.   MRN: [id number]  HPI: Bobby Padilla is a 25 y.o.-year-old male, returning for f/u for DM1, dx 2004, uncontrolled, without complications. He was previously seen by Dr. Tobe Sos. Last visit with me 3.5 mo ago. New insurance plan this year (still [employer]).  Last hemoglobin A1c was: 06/24/2015: HbA1c 7.6% Lab Results  Component Value Date   HGBA1C 7.9 09/25/2013   HGBA1C 213 05/17/2013   HGBA1C 7.4 09/19/2012   Patient is on the regimen of: - Lantus 40 >> 35 units at bedtime - Novolog with meals: ICR 1:10 >> 1:8 ISF 30 Target 130 If you have to do a correction at night, please subtract 2 units from the calculated dose.  He was on a pump before, but he was very active and could not keep the pump attached. He had a Cosmo pump first, then a Medtronic pump.   Meter: One Touch Verio  Pt checks his sugars ~4x a day and they are: - am: 81-185, 220 >> 66-156, 186, 312 - 2h after b'fast: 89-199, 346 >> 87-190 - before lunch: 85-267 >> 92-194, 234 - 2h after lunch: 318 >> 94-163, 256 - before dinner: 82-280, 389 >> 77, 113-286, 312 - 2h after dinner: 154-402 >> 158-275, 391, 483 - bedtime: See above - nighttime: 128-411 >> n/c No lows. Lowest sugar was 64; he has hypoglycemia awareness at 70-80. No previous hypoglycemia admission. He has a glucagon kit at home. He never had to use it. Highest sugar was 400-500 (seldom) >> now 483. No previous DKA admissions.    Pt's meals are: - Breakfast: [place] Yoghurt + coffee + cream - boluses ~2-3 units - Lunch: sandwich + chips or 1 apple - boluses ~ 5-6 units - Dinner: meat + starch + veggie - boluses ~ 6-9 units - Snacks: 1-2 - boluses if >15 g carbs  - no CKD, last BUN/creatinine:  06/24/2015: BUN/Cr creatinine 16/0.96 Lab Results  Component Value Date   BUN 17 09/20/2013   CREATININE 1.00 09/20/2013   06/24/2015: ACR 8.4 - last set of lipids: Lab Results  Component Value Date    CHOL 149 09/20/2013   HDL 58 09/20/2013   LDLCALC 82 09/20/2013   TRIG 45 09/20/2013   CHOLHDL 2.6 09/20/2013  In 06/24/2015: He had significant transaminitis, with an AST of 220 and an ALT of 110 >> resolved upon recheck.  - last eye exam was in 2014. No DR.  - no numbness and tingling in his feet.  Patient also has a history of hypothyroidism, however his thyroid tests have normalized so he does not need levothyroxine. I reviewed his thyroid antibodies and they were normal.  Last TSH: Lab Results  Component Value Date   TSH 1.02 08/05/2015   Component     Latest Ref Rng 03/02/2012 09/19/2012 09/20/2013  Thyroperoxidase Ab SerPl-aCnc     <35.0 IU/mL 13.9 16.5 13.9   ROS: Constitutional: no weight gain/loss, no fatigue, no subjective hyperthermia/hypothermia Eyes: no blurry vision, no xerophthalmia ENT: no sore throat, no nodules palpated in throat, no dysphagia/odynophagia, no hoarseness Cardiovascular: no CP/SOB/palpitations/leg swelling Respiratory: + cough/SOB Gastrointestinal: no N/V/+ D/no C/heartburn Musculoskeletal: no muscle/joint aches Skin: no rashes Neurological: no tremors/numbness/tingling/dizziness, + HA - with aura, once a year  I reviewed pt's medications, allergies, PMH, social hx, family hx, and changes were documented in the history of present illness. Otherwise, unchanged from my initial visit note.  Past Medical History  Diagnosis Date  . Diabetes mellitus    No past surgical history on file. Social History   Social History  . Marital Status: Single    Spouse Name: N/A  . Number of Children: 0  . Years of Education: N/A   Occupational History  .  unemployed    Social History Main Topics  . Smoking status: Never Smoker   . Smokeless tobacco: Never Used  . Alcohol Use: No  . Drug Use: No   Current Outpatient Prescriptions on File Prior to Visit  Medication Sig Dispense Refill  . BAYER MICROLET LANCETS lancets Use to test blood sugar 6  times daily. 200 each 5  . Blood Glucose Monitoring Suppl (BAYER CONTOUR NEXT MONITOR) w/Device KIT Use to test blood sugar daily. 1 kit 0  . GLUCAGON EMERGENCY 1 MG injection USE AS DIRECTED FOR SEVERE HYPOGLYCEMIA 2 kit 0  . glucose blood (BAYER CONTOUR NEXT TEST) test strip Use to test blood sugar 6 times daily. 200 each 5  . insulin aspart (NOVOLOG FLEXPEN) 100 UNIT/ML FlexPen Use up to 50 units daily 5 pen 1  . Insulin Glargine (LANTUS SOLOSTAR) 100 UNIT/ML Solostar Pen INJECT 40 UNITS AT BEDTIME 15 mL 6  . Insulin Syringe-Needle U-100 (B-D INSULIN SYRINGE) 31G X 5/16" 0.3 ML MISC Use with insulin 6 shots per day 250 each 2   No current facility-administered medications on file prior to visit.   No Known Allergies Family History  Problem Relation Age of Onset  . Diabetes Maternal Aunt   . Diabetes Maternal Uncle   . Heart attack Neg Hx    PE: BP 110/68 mmHg  Pulse 67  Temp(Src) 98.3 F (36.8 C) (Oral)  Resp 12  Wt 190 lb 12.8 oz (86.546 kg)  SpO2 98% Body mass index is 24.49 kg/(m^2). Wt Readings from Last 3 Encounters:  11/21/15 190 lb 12.8 oz (86.546 kg)  08/05/15 185 lb 12.8 oz (84.278 kg)  09/25/13 177 lb 3.2 oz (80.377 kg)   Constitutional: normal weight, in NAD Eyes: PERRLA, EOMI, no exophthalmos ENT: moist mucous membranes, no thyromegaly, no cervical lymphadenopathy Cardiovascular: RRR, No MRG Respiratory: CTA B Gastrointestinal: abdomen soft, NT, ND, BS+ Musculoskeletal: no deformities, strength intact in all 4 Skin: moist, warm, no rashes Neurological: no tremor with outstretched hands, DTR normal in all 4  ASSESSMENT: 1. DM1, uncontrolled, without complications - I  recommended to get an Accu-Chek expert glucometer which can cause delayed his insulin doses based on his sliding scale and the total number of carbs used at a meal -sent prescription to his pharmacy >> did not get it >> not covered  2. H/o hypothyroidism  PLAN:  1. Patient with long-standing,  uncontrolled DM1, on basal-bolus insulin therapy. An insulin pump in the past, however, this kept coming off as he was doing a lot of sports at that time. He is now exercising 3-4 times a week. He refuses an insulin pump or CGM for now. - he is doing a great job checking his sugar frequently and bring a great CBG log! - his sugars in am are close to target but they increase especially around dinnertime >> I advised him to try to split Lantus in 2 doses >> he agrees to try. We will also decrease his sliding scale target. - for corrections at night, I again advised him not to do so unless his sugars are in the 200s, and then to subtract 2 units from the estimated dose - We discussed about  changes to his insulin regimen, as follows:  Patient Instructions  Please split Lantus: - 15 units in am and 20 units at bedtime  Continue: - Novolog with meals: ICR 1:8 ISF 30  Target 130 >> 120  If you have to do a correction at night, please subtract 2 units from the calculated dose.  Please come back for a follow-up appointment in 3 months (in am).  - continue checking sugars at different times of the day - check at least 4 times a day, rotating checks - advised for yearly eye exams >> needs to schedule one - Given flu shot at last visit - no signs of other autoimmune disorders - will check Lipids at next visit  - checked hbA1c today >> 7.5% (slightly better) - Return to clinic in 3 mo with sugar log.   2. H/o hypothyroidism - last TSH normal 3 mo ago: Lab Results  Component Value Date   TSH 1.02 08/05/2015   - time spent with the patient: 40 min, of which >50% was spent in obtaining information about hisdisease, reviewing previous labs, office visit notes, and DM treatments, counseling pt about his condition (please see the discussed topics above), and developing a plan to prevent further hypoglycemia and hyperglycemia.Pt had a number of questions which I addressed.

## 2015-12-13 ENCOUNTER — Encounter: Payer: Self-pay | Admitting: Internal Medicine

## 2015-12-15 ENCOUNTER — Other Ambulatory Visit: Payer: Self-pay | Admitting: *Deleted

## 2015-12-15 MED ORDER — GLUCOSE BLOOD VI STRP: ORAL_STRIP | Status: DC

## 2016-02-27 ENCOUNTER — Ambulatory Visit: Payer: BLUE CROSS/BLUE SHIELD | Admitting: Internal Medicine

## 2016-03-04 ENCOUNTER — Other Ambulatory Visit: Payer: Self-pay

## 2016-03-04 DIAGNOSIS — E1065 Type 1 diabetes mellitus with hyperglycemia: Principal | ICD-10-CM

## 2016-03-04 DIAGNOSIS — IMO0001 Reserved for inherently not codable concepts without codable children: Secondary | ICD-10-CM

## 2016-03-04 MED ORDER — INSULIN ASPART 100 UNIT/ML FLEXPEN: PEN_INJECTOR | SUBCUTANEOUS | Status: DC

## 2016-05-19 LAB — HM DIABETES EYE EXAM

## 2016-05-27 ENCOUNTER — Encounter: Payer: Self-pay | Admitting: Internal Medicine

## 2016-05-27 ENCOUNTER — Ambulatory Visit (INDEPENDENT_AMBULATORY_CARE_PROVIDER_SITE_OTHER): Payer: BLUE CROSS/BLUE SHIELD | Admitting: Internal Medicine

## 2016-05-27 VITALS — BP 118/70 | HR 54 | Ht 74.0 in | Wt 188.0 lb

## 2016-05-27 DIAGNOSIS — IMO0001 Reserved for inherently not codable concepts without codable children: Secondary | ICD-10-CM

## 2016-05-27 DIAGNOSIS — E109 Type 1 diabetes mellitus without complications: Secondary | ICD-10-CM | POA: Diagnosis not present

## 2016-05-27 DIAGNOSIS — E038 Other specified hypothyroidism: Secondary | ICD-10-CM

## 2016-05-27 DIAGNOSIS — E1065 Type 1 diabetes mellitus with hyperglycemia: Principal | ICD-10-CM

## 2016-05-27 LAB — POCT GLYCOSYLATED HEMOGLOBIN (HGB A1C): HEMOGLOBIN A1C: 6.9

## 2016-05-27 MED ORDER — INSULIN GLARGINE 100 UNIT/ML SOLOSTAR PEN: PEN_INJECTOR | SUBCUTANEOUS | 6 refills | Status: DC

## 2016-05-27 NOTE — Addendum Note (Signed)
Addended by: Darene LamerHOMPSON, Lennell Shanks T on: 05/27/2016 11:12 AM   Modules accepted: Orders

## 2016-05-27 NOTE — Progress Notes (Addendum)
Patient ID: Bobby Padilla, male   DOB: [birth year], 25 y.o.   MRN: [id number]  HPI: Bobby Padilla is a 25 y.o.-year-old male, returning for f/u for DM1, dx 2004, uncontrolled, without complications. He was previously seen by Dr. Tobe Sos. Last visit with me 6 mo ago. New insurance plan this year (still [employer]).  Last hemoglobin A1c was: 06/24/2015: HbA1c 7.6% Lab Results  Component Value Date   HGBA1C 7.5 11/21/2015   HGBA1C 7.9 09/25/2013   HGBA1C 213 05/17/2013   Patient is on a regimen of: - Lantus 15 >> 19 units in am and 20 >> 19 units at bedtime - Novolog with meals: ICR 1:8 ISF 30  Target 120  He was on a pump before, but he was very active and could not keep the pump attached. He had a Cosmo pump first, then a Medtronic pump.   Meter: One Touch Verio  Pt checks his sugars ~4x a day and they are: - am: 81-185, 220 >> 66-156, 186, 312 >> 73-210, 322 - 2h after b'fast: 89-199, 346 >> 87-190 >> 97-248 - before lunch: 85-267 >> 92-194, 234 >> 64-140, 224, 310 - 2h after lunch: 318 >> 94-163, 256 >> 85-357 (missed inj) - before dinner: 82-280, 389 >> 77, 113-286, 312 >> 76-324 - 2h after dinner: 154-402 >> 158-275, 391, 483 >> 165-177 - bedtime: See above >> 151-307 - nighttime: 128-411 >> n/c No lows. Lowest sugar was 64 >> 64; he has hypoglycemia awareness at 70-80. No previous hypoglycemia admission. He has a glucagon kit at home. He never had to use it. Highest sugar was 400-500 (seldom) >> 483 >> 300s. No previous DKA admissions.    Pt's meals are: - Breakfast: [place] Yoghurt + coffee + cream - boluses ~2-3 units - Lunch: sandwich + chips or 1 apple - boluses ~ 5-6 units - Dinner: meat + starch + veggie - boluses ~ 6-9 units - Snacks: 1-2 - boluses if >15 g carbs  - no CKD, last BUN/creatinine:  06/24/2015: BUN/Cr creatinine 16/0.96 Lab Results  Component Value Date   BUN 17 09/20/2013   CREATININE 1.00 09/20/2013   06/24/2015: ACR 8.4 - last set of lipids: Lab  Results  Component Value Date   CHOL 149 09/20/2013   HDL 58 09/20/2013   LDLCALC 82 09/20/2013   TRIG 45 09/20/2013   CHOLHDL 2.6 09/20/2013  In 06/24/2015: He had significant transaminitis, with an AST of 220 and an ALT of 110 >> resolved upon recheck.  - last eye exam was in 05/19/2016. No DR.  - no numbness and tingling in his feet.  Patient also has a history of hypothyroidism, however his thyroid tests have normalized so he does not need levothyroxine. I reviewed his thyroid antibodies and they were normal.  Last TSH: Lab Results  Component Value Date   TSH 1.02 08/05/2015   Component     Latest Ref Rng 03/02/2012 09/19/2012 09/20/2013  Thyroperoxidase Ab SerPl-aCnc     <35.0 IU/mL 13.9 16.5 13.9   ROS: Constitutional: no weight gain/loss, no fatigue, no subjective hyperthermia/hypothermia Eyes: no blurry vision, no xerophthalmia ENT: no sore throat, no nodules palpated in throat, no dysphagia/odynophagia, no hoarseness Cardiovascular: no CP/SOB/palpitations/leg swelling Respiratory: no cough/SOB Gastrointestinal: no N/V/D/C/heartburn Musculoskeletal: no muscle/joint aches Skin: no rashes Neurological: no tremors/numbness/tingling/dizziness, + HA - with aura, once a year  I reviewed pt's medications, allergies, PMH, social hx, family hx, and changes were documented in the history of present illness. Otherwise, unchanged from  my initial visit note.  Past Medical History:  Diagnosis Date  . Diabetes mellitus    No past surgical history on file. Social History   Social History  . Marital Status: Single    Spouse Name: N/A  . Number of Children: 0  . Years of Education: N/A   Occupational History  .  unemployed    Social History Main Topics  . Smoking status: Never Smoker   . Smokeless tobacco: Never Used  . Alcohol Use: No  . Drug Use: No   Current Outpatient Prescriptions on File Prior to Visit  Medication Sig Dispense Refill  . GLUCAGON EMERGENCY 1 MG  injection USE AS DIRECTED FOR SEVERE HYPOGLYCEMIA 2 kit 0  . glucose blood (ONETOUCH VERIO) test strip Use to test blood sugar 6 times daily as instructed. Dx: E10.9 200 each 11  . insulin aspart (NOVOLOG FLEXPEN) 100 UNIT/ML FlexPen Use up to 30 units daily 5 pen 1  . Insulin Glargine (LANTUS SOLOSTAR) 100 UNIT/ML Solostar Pen INJECT 15 UNITS in am and 20 units at bedtime 15 mL 6  . Insulin Syringe-Needle U-100 (B-D INSULIN SYRINGE) 31G X 5/16" 0.3 ML MISC Use with insulin 6 shots per day 250 each 2   No current facility-administered medications on file prior to visit.    No Known Allergies Family History  Problem Relation Age of Onset  . Diabetes Maternal Aunt   . Diabetes Maternal Uncle   . Heart attack Neg Hx    PE: BP 118/70 (BP Location: Left Arm, Patient Position: Sitting)   Pulse (!) 54   Ht _0  (1.88 m)   Wt 188 lb (85.3 kg)   SpO2 98%   BMI 24.14 kg/m  Body mass index is 24.14 kg/m. Wt Readings from Last 3 Encounters:  05/27/16 188 lb (85.3 kg)  11/21/15 190 lb 12.8 oz (86.5 kg)  08/05/15 185 lb 12.8 oz (84.3 kg)   Constitutional: normal weight, in NAD Eyes: PERRLA, EOMI, no exophthalmos ENT: moist mucous membranes, no thyromegaly, no cervical lymphadenopathy Cardiovascular: RRR, No MRG Respiratory: CTA B Gastrointestinal: abdomen soft, NT, ND, BS+ Musculoskeletal: no deformities, strength intact in all 4 Skin: moist, warm, no rashes Neurological: no tremor with outstretched hands, DTR normal in all 4  ASSESSMENT: 1. DM1, uncontrolled, without complications - I  recommended to get an Accu-Chek expert glucometer which can cause delayed his insulin doses based on his sliding scale and the total number of carbs used at a meal -sent prescription to his pharmacy >> did not get it >> not covered  2. H/o hypothyroidism  PLAN:  1. Patient with long-standing, uncontrolled DM1, on basal-bolus insulin therapy. He was on an insulin pump in the past, however, this kept  coming off as he was doing a lot of sports at that time. He is exercising 3-4 times a week. He refuses an insulin pump or CGM. - he is doing a great job checking his sugar frequently and bring a great CBG log! - his sugars are very variable: higher during the weekend and around dinnertime >> he feels this is from incorrect carb counting >> will start doing a better job with this and I also advised him to do an ICR test with each meal - Will continue current regimen: Patient Instructions  Please continue: - Lantus 19 units in am and 19 units at bedtime - Novolog with meals: ICR 1:8 ISF 30  Target 120  Try to do a ICR test for each of  the meals.  Please stop at the lab.  Please come back for a follow-up appointment in 3 months.    - continue checking sugars at different times of the day - check at least 4 times a day, rotating checks - advised for yearly eye exams >> UTD - no signs of other autoimmune disorders - will check Lipids, CMP, ACR next Monday per his preference - checked hbA1c today >> 6.9% (better) - Return to clinic in 3 mo with sugar log.   2. H/o hypothyroidism - last TSH normal 10 mo ago: Lab Results  Component Value Date   TSH 1.02 08/05/2015   Component     Latest Ref Rng & Units 05/31/2016  Sodium     135 - 146 mmol/L 140  Potassium     3.5 - 5.3 mmol/L 4.6  Chloride     98 - 110 mmol/L 101  CO2     20 - 31 mmol/L 29  Glucose     65 - 99 mg/dL 199 (H)  BUN     7 - 25 mg/dL 15  Creatinine     0.60 - 1.35 mg/dL 0.97  Total Bilirubin     0.2 - 1.2 mg/dL 0.6  Alkaline Phosphatase     40 - 115 U/L 54  AST     10 - 40 U/L 13  ALT     9 - 46 U/L 14  Total Protein     6.1 - 8.1 g/dL 6.5  Albumin     3.6 - 5.1 g/dL 4.6  Calcium     8.6 - 10.3 mg/dL 9.8  GFR, Est African American     >=60 mL/min >89  GFR, Est Non African American     >=60 mL/min >89  Cholesterol     0 - 200 mg/dL 157  Triglycerides     0.0 - 149.0 mg/dL 66.0  HDL  Cholesterol     >39.00 mg/dL 61.50  VLDL     0.0 - 40.0 mg/dL 13.2  LDL (calc)     0 - 99 mg/dL 83  Total CHOL/HDL Ratio      3  NonHDL      95.82  Microalb, Ur     0.0 - 1.9 mg/dL <0.7  Creatinine,U     mg/dL 107.4  MICROALB/CREAT RATIO     0.0 - 30.0 mg/g 0.7  TSH     0.35 - 4.50 uIU/mL 1.00   Office Visit on 05/27/2016  Component Date Value Ref Range Status  . HM Diabetic Eye Exam 05/19/2016 No Retinopathy  No Retinopathy Final  . Hemoglobin A1C 05/27/2016 6.9   Final   Labs are great exc. High Glu.  Philemon Kingdom, MD PhD [facility] Endocrinology

## 2016-05-27 NOTE — Patient Instructions (Addendum)
Please continue: - Lantus 19 units in am and 19 units at bedtime - Novolog with meals: ICR 1:8 ISF 30  Target 120  Try to do a ICR test for each of the meals.  Please stop at the lab.  Please come back for a follow-up appointment in 3 months.

## 2016-05-31 ENCOUNTER — Other Ambulatory Visit (INDEPENDENT_AMBULATORY_CARE_PROVIDER_SITE_OTHER): Payer: BLUE CROSS/BLUE SHIELD

## 2016-05-31 DIAGNOSIS — IMO0001 Reserved for inherently not codable concepts without codable children: Secondary | ICD-10-CM

## 2016-05-31 DIAGNOSIS — E1065 Type 1 diabetes mellitus with hyperglycemia: Principal | ICD-10-CM

## 2016-05-31 DIAGNOSIS — E109 Type 1 diabetes mellitus without complications: Secondary | ICD-10-CM | POA: Diagnosis not present

## 2016-05-31 DIAGNOSIS — E038 Other specified hypothyroidism: Secondary | ICD-10-CM

## 2016-05-31 LAB — COMPLETE METABOLIC PANEL WITH GFR
ALT: 14 U/L (ref 9–46)
AST: 13 U/L (ref 10–40)
Albumin: 4.6 g/dL (ref 3.6–5.1)
Alkaline Phosphatase: 54 U/L (ref 40–115)
BUN: 15 mg/dL (ref 7–25)
CALCIUM: 9.8 mg/dL (ref 8.6–10.3)
CHLORIDE: 101 mmol/L (ref 98–110)
CO2: 29 mmol/L (ref 20–31)
Creat: 0.97 mg/dL (ref 0.60–1.35)
GFR, Est Non African American: >89 mL/min (ref >=60)
Glucose, Bld: 199 mg/dL — ABNORMAL HIGH (ref 65–99)
POTASSIUM: 4.6 mmol/L (ref 3.5–5.3)
Sodium: 140 mmol/L (ref 135–146)
Total Bilirubin: 0.6 mg/dL (ref 0.2–1.2)
Total Protein: 6.5 g/dL (ref 6.1–8.1)

## 2016-05-31 LAB — LIPID PANEL
CHOLESTEROL: 157 mg/dL (ref 0–200)
HDL: 61.5 mg/dL (ref >39.00)
LDL CALC: 83 mg/dL (ref 0–99)
NonHDL: 95.82
TRIGLYCERIDES: 66 mg/dL (ref 0.0–149.0)
Total CHOL/HDL Ratio: 3
VLDL: 13.2 mg/dL (ref 0.0–40.0)

## 2016-05-31 LAB — MICROALBUMIN / CREATININE URINE RATIO
CREATININE, U: 107.4 mg/dL
Microalb Creat Ratio: 0.7 mg/g (ref 0.0–30.0)

## 2016-05-31 LAB — TSH: TSH: 1 u[IU]/mL (ref 0.35–4.50)

## 2016-06-04 ENCOUNTER — Telehealth: Payer: Self-pay | Admitting: Internal Medicine

## 2016-06-04 ENCOUNTER — Other Ambulatory Visit: Payer: Self-pay | Admitting: Internal Medicine

## 2016-06-04 DIAGNOSIS — IMO0001 Reserved for inherently not codable concepts without codable children: Secondary | ICD-10-CM

## 2016-06-04 DIAGNOSIS — E1065 Type 1 diabetes mellitus with hyperglycemia: Principal | ICD-10-CM

## 2016-06-04 NOTE — Telephone Encounter (Signed)
PT needs his Novolog script sent to [facility].  He is going out of town and needs this sent in asap.

## 2016-08-30 ENCOUNTER — Ambulatory Visit: Payer: BLUE CROSS/BLUE SHIELD | Admitting: Internal Medicine

## 2016-08-31 ENCOUNTER — Other Ambulatory Visit: Payer: Self-pay

## 2016-08-31 ENCOUNTER — Telehealth: Payer: Self-pay | Admitting: Internal Medicine

## 2016-08-31 DIAGNOSIS — IMO0001 Reserved for inherently not codable concepts without codable children: Secondary | ICD-10-CM

## 2016-08-31 DIAGNOSIS — E1065 Type 1 diabetes mellitus with hyperglycemia: Principal | ICD-10-CM

## 2016-08-31 MED ORDER — GLUCOSE BLOOD VI STRP: ORAL_STRIP | 11 refills | Status: AC

## 2016-08-31 MED ORDER — FREESTYLE LANCETS MISC: 12 refills | Status: DC

## 2016-08-31 MED ORDER — INSULIN ASPART 100 UNIT/ML FLEXPEN
PEN_INJECTOR | SUBCUTANEOUS | 2 refills | Status: DC
Start: 2016-08-31 — End: 2017-01-13

## 2016-08-31 NOTE — Telephone Encounter (Signed)
Sent!

## 2016-08-31 NOTE — Telephone Encounter (Signed)
Pt has recently moved and needs the novolog flex pen, lantus solostar pens, and the one touch verio test strips and lancets call into [facility] at [address], in [place] # [phone]

## 2016-09-02 NOTE — Telephone Encounter (Signed)
Patient need PA on medications Novolog flex pen, Lantus solostar pens. verio test strips and lancets.

## 2016-09-13 ENCOUNTER — Telehealth: Payer: Self-pay | Admitting: Internal Medicine

## 2016-09-13 NOTE — Telephone Encounter (Signed)
Pt called in and said that he is still waiting for a PA to be done for his medications through the [facility] [place].  He also said he still needs his Lantus sent to that pharmacy. [facility] not [employer]

## 2016-09-14 MED ORDER — INSULIN GLARGINE 100 UNIT/ML SOLOSTAR PEN: PEN_INJECTOR | SUBCUTANEOUS | 6 refills | Status: DC

## 2016-09-14 NOTE — Telephone Encounter (Signed)
Spoke to patient. Advised sent Lantus Rx to pharmacy as requested. Called [facility] staff states not showing PA needed for Novolog, but would run claim again.  Patient paid cash for last Novolog Rx, but would need PA to be reimbursed and pay lower amt in future.  Advised patient would look to see if PA was faxed, if no communication from pharmacy by 12 pm tomorrow. Will call pharmacy back to get status. Patient verbalized understand.

## 2016-09-15 NOTE — Telephone Encounter (Signed)
Novolog PA approved through 10/03/2017. PA# [id number] Called pharmacy, they were closed for lunch. Notified patient, gave PA info, Pt will inform pharmacy.

## 2016-09-17 ENCOUNTER — Telehealth: Payer: Self-pay | Admitting: Internal Medicine

## 2016-09-17 NOTE — Telephone Encounter (Signed)
Pt is asking for a call when this is done

## 2016-09-17 NOTE — Telephone Encounter (Signed)
Novolog PA is not working for when the pt picked up the meds, he picked them up on 09/02/16, pharmacist is requesting that we see if we can get a new PA for the med beginning in the month of november

## 2016-09-17 NOTE — Telephone Encounter (Signed)
I contacted the pharmacy and advised we obtained the Novolog PA approved through 10/03/2017. PA# [id number] Called pharmacy, they were closed for lunch. Notified patient, gave PA info, Pt will inform pharmacy. Pharmacy tech stated the medication is still showing a PA on their side she stated when the pharmacist returns to work on 09/20/2016 he will contact the insurance company and see what the problem is. I contacted the patient and advised of this as well and he verbalized understanding.

## 2017-01-13 ENCOUNTER — Other Ambulatory Visit: Payer: Self-pay | Admitting: Internal Medicine

## 2017-01-13 DIAGNOSIS — IMO0001 Reserved for inherently not codable concepts without codable children: Secondary | ICD-10-CM

## 2017-01-13 DIAGNOSIS — E1065 Type 1 diabetes mellitus with hyperglycemia: Principal | ICD-10-CM

## 2017-04-10 ENCOUNTER — Other Ambulatory Visit: Payer: Self-pay | Admitting: Internal Medicine

## 2017-04-10 DIAGNOSIS — E1065 Type 1 diabetes mellitus with hyperglycemia: Principal | ICD-10-CM

## 2017-04-10 DIAGNOSIS — IMO0001 Reserved for inherently not codable concepts without codable children: Secondary | ICD-10-CM

## 2017-04-11 ENCOUNTER — Other Ambulatory Visit: Payer: Self-pay | Admitting: Internal Medicine

## 2017-04-11 ENCOUNTER — Other Ambulatory Visit: Payer: Self-pay

## 2017-04-11 DIAGNOSIS — IMO0001 Reserved for inherently not codable concepts without codable children: Secondary | ICD-10-CM

## 2017-04-11 DIAGNOSIS — E1065 Type 1 diabetes mellitus with hyperglycemia: Principal | ICD-10-CM

## 2017-04-11 MED ORDER — INSULIN ASPART 100 UNIT/ML FLEXPEN: PEN_INJECTOR | SUBCUTANEOUS | 0 refills | Status: DC

## 2017-04-11 NOTE — Telephone Encounter (Signed)
Last seen 05/27/16, okay to refill? No other appointments made. Thanks!

## 2017-04-11 NOTE — Telephone Encounter (Signed)
Denied RX.  

## 2017-04-11 NOTE — Telephone Encounter (Signed)
No, he was lost for f/u 

## 2017-04-19 ENCOUNTER — Other Ambulatory Visit: Payer: Self-pay

## 2017-04-19 DIAGNOSIS — IMO0001 Reserved for inherently not codable concepts without codable children: Secondary | ICD-10-CM

## 2017-04-19 DIAGNOSIS — E1065 Type 1 diabetes mellitus with hyperglycemia: Principal | ICD-10-CM

## 2017-04-19 MED ORDER — INSULIN ASPART 100 UNIT/ML FLEXPEN: PEN_INJECTOR | SUBCUTANEOUS | 0 refills | Status: DC

## 2023-02-22 ENCOUNTER — Encounter: Payer: Self-pay | Admitting: Internal Medicine

## 2023-02-22 ENCOUNTER — Ambulatory Visit: Payer: BC Managed Care – PPO | Admitting: Internal Medicine

## 2023-02-22 ENCOUNTER — Other Ambulatory Visit: Payer: Self-pay | Admitting: Internal Medicine

## 2023-02-22 VITALS — BP 126/82 | HR 55 | Ht 74.0 in | Wt 177.0 lb

## 2023-02-22 DIAGNOSIS — E109 Type 1 diabetes mellitus without complications: Secondary | ICD-10-CM

## 2023-02-22 LAB — POCT GLYCOSYLATED HEMOGLOBIN (HGB A1C): Hemoglobin A1C: 6.7 % — AB (ref 4.0–5.6)

## 2023-02-22 MED ORDER — INSULIN PEN NEEDLE 32G X 4 MM MISC: 1.0000 | Freq: Four times a day (QID) | 3 refills | Status: DC

## 2023-02-22 MED ORDER — DEXCOM G7 SENSOR MISC: 1.0000 | 3 refills | Status: DC

## 2023-02-22 MED ORDER — GLUCAGON EMERGENCY 1 MG IJ KIT: 1.0000 mg | PACK | INTRAMUSCULAR | 0 refills | Status: AC | PRN

## 2023-02-22 MED ORDER — TOUJEO MAX SOLOSTAR 300 UNIT/ML ~~LOC~~ SOPN: 22.0000 [IU] | PEN_INJECTOR | Freq: Every day | SUBCUTANEOUS | 1 refills | Status: DC

## 2023-02-22 MED ORDER — NOVOLOG FLEXPEN 100 UNIT/ML ~~LOC~~ SOPN: PEN_INJECTOR | SUBCUTANEOUS | 3 refills | Status: DC

## 2023-02-22 NOTE — Patient Instructions (Addendum)
Please have labs done at [facility] :   Address : [address], [place], [place] [address] Phone [phone]      Toujeo 20 units daily  NovoLog 1:8 with each meal  Novolog correctional insulin: ADD extra units on insulin to your meal-time Novolog dose if your blood sugars are higher than 155. Use the scale below to help guide you:   Blood sugar before meal Number of units to inject  Less than 155 0 unit  156 -  190 1 units  191 -  225 2 units  226 -  260 3 units  261 -  295 4 units  296 -  330 5 units    HOW TO TREAT LOW BLOOD SUGARS (Blood sugar LESS THAN 70 MG/DL) Please follow the RULE OF 15 for the treatment of hypoglycemia treatment (when your (blood sugars are less than 70 mg/dL)   STEP 1: Take 15 grams of carbohydrates when your blood sugar is low, which includes:  3-4 GLUCOSE TABS  OR 3-4 OZ OF JUICE OR REGULAR SODA OR ONE TUBE OF GLUCOSE GEL    STEP 2: RECHECK blood sugar in 15 MINUTES STEP 3: If your blood sugar is still low at the 15 minute recheck --> then, go back to STEP 1 and treat AGAIN with another 15 grams of carbohydrates.

## 2023-02-22 NOTE — Progress Notes (Unsigned)
Name: Bobby Padilla  MRN/ DOB: [id number], 08/16/1991   Age/ Sex: 32 y.o., male    PCP: Milus Height, PA   Reason for Endocrinology Evaluation: Type 1 Diabetes Mellitus     Date of Initial Endocrinology Visit: 02/22/2023     PATIENT IDENTIFIER: [name] is a 32 y.o. male with a past medical history of DM. The patient presented for initial endocrinology clinic visit on 02/22/2023 for consultative assistance with his diabetes management.    HPI: [name] was    Diagnosed with DM 2004 He was on the Omnipod years ago  Currently checking blood sugars multiple x / day, CGM Hypoglycemia episodes : yes               Symptoms: occasionally                  Frequency: at night Hemoglobin A1c has ranged from 6.9% in 2017, peaking at 7.9% in 2014.   In terms of diet, the patient eats 2 meals a day, snacks in the morning and at times in the afternoon , avoids sugar    Patient used to follow-up with Dr. Fransico Michael with peds endocrinology, he subsequently establish care with Dr. Elvera Lennox, he last saw her in 2017 but moved to [place], [place] Patient to establish care with [facility] upon his [facility]  Paternal uncle with T1DM   HOME DIABETES REGIMEN: Toujeo 24 units daily  NovoLog 1: C 1:8  Targe 120  SF 30  Statin: no ACE-I/ARB: no Prior Diabetic Education: yes   CONTINUOUS GLUCOSE MONITORING RECORD INTERPRETATION    Dates of Recording: 5/8-5/21/204  Sensor description: dexcom  Results statistics:   CGM use % of time 93  Average and SD 163/68  Time in range     56   %  % Time Above 180 27  % Time above 250 11  % Time Below target 3   Glycemic patterns summary: Bg's mostly within  range  Hyperglycemic episodes  postprandial   Hypoglycemic episodes occurred at night and post bolus   Overnight periods: trends down     DIABETIC COMPLICATIONS: Microvascular complications:   Denies: neuropathy, CKD  Last eye exam: Completed  02/2023  Macrovascular complications:   Denies: CAD, PVD, CVA   PAST HISTORY: Past Medical History:  Past Medical History:  Diagnosis Date   Diabetes mellitus    Past Surgical History: No past surgical history on file.  Social History:  reports that he has never smoked. He has never used smokeless tobacco. No history on file for alcohol use and drug use. Family History:  Family History  Problem Relation Age of Onset   Diabetes Maternal Aunt    Diabetes Maternal Uncle    Heart attack Neg Hx      HOME MEDICATIONS: Allergies as of 02/22/2023   No Known Allergies      Medication List        Accurate as of Feb 22, 2023  2:24 PM. If you have any questions, ask your nurse or doctor.          STOP taking these medications    freestyle lancets Stopped by: Scarlette Shorts, MD       TAKE these medications    Dexcom G7 Sensor Misc 1 each by Does not apply route continuous.   Glucagon Emergency 1 MG Kit USE AS DIRECTED FOR SEVERE HYPOGLYCEMIA   glucose blood test strip Commonly known as:  OneTouch Verio Use to test blood sugar 6 times daily as instructed. Dx: E10.9   insulin aspart 100 UNIT/ML FlexPen Commonly known as: NovoLOG FlexPen INJECT UP TO 30 UNITS SUBCUTANEOUSLY DAILY   Insulin Syringe-Needle U-100 31G X 5/16" 0.3 ML Misc Commonly known as: B-D INSULIN SYRINGE Use with insulin 6 shots per day   Toujeo Max SoloStar 300 UNIT/ML Solostar Pen Generic drug: insulin glargine (2 Unit Dial) Inject 24 Units into the skin daily at 6 (six) AM. What changed: Another medication with the same name was removed. Continue taking this medication, and follow the directions you see here. Changed by: Scarlette Shorts, MD         ALLERGIES: No Known Allergies   REVIEW OF SYSTEMS: A comprehensive ROS was conducted with the patient and is negative except as per HPI and below:  Review of Systems  Gastrointestinal:  Negative for constipation, diarrhea,  nausea and vomiting.  Neurological:  Negative for tingling.      OBJECTIVE:   VITAL SIGNS: BP 126/82 (BP Location: Right Arm, Patient Position: Sitting, Cuff Size: Small)   Pulse (!) 55   Ht 6\' 2"  (1.88 m)   Wt 177 lb (80.3 kg)   SpO2 99%   BMI 22.73 kg/m    PHYSICAL EXAM:  General: Pt appears well and is in NAD  Neck: General: Supple without adenopathy or carotid bruits. Thyroid: Thyroid size normal.  No goiter or nodules appreciated.   Lungs: Clear with good BS bilat   Heart: RRR   Abdomen:  soft, nontender  Extremities:  Lower extremities - No pretibial edema.   Neuro: MS is good with appropriate affect, pt is alert and Ox3    DM foot exam: 02/22/2023  The skin of the feet is intact without sores or ulcerations. The pedal pulses are 2+ on right and 2+ on left. The sensation is intact to a screening 5.07, 10 gram monofilament bilaterally   DATA REVIEWED:  Lab Results  Component Value Date   HGBA1C 6.9 05/27/2016   HGBA1C 7.5 11/21/2015   HGBA1C 7.9 09/25/2013   Lab Results  Component Value Date   MICROALBUR <0.7 05/31/2016   LDLCALC 83 05/31/2016   CREATININE 0.97 05/31/2016   Lab Results  Component Value Date   MICRALBCREAT 0.7 05/31/2016    Lab Results  Component Value Date   CHOL 157 05/31/2016   HDL 61.50 05/31/2016   LDLCALC 83 05/31/2016   TRIG 66.0 05/31/2016   CHOLHDL 3 05/31/2016        ASSESSMENT / PLAN / RECOMMENDATIONS:   1) Type 1 Diabetes Mellitus, Optimally controlled, Without complications - Most recent A1c of 6.7 %. Goal A1c < 7.0 %.    -Patient with optimal glycemic control -In reviewing his CGM download, he has been noted with hypoglycemia overnight as well as after bolus -I have encouraged the patient to take NovoLog before each meal rather than after the meal -Will reduce basal insulin to prevent overnight hypoglycemia -I will also adjust his sensitivity factor from 30 to 35 , as he has been noted with post bolus  hypoglycemia  -We do not have a phlebotomist today, he was printed lab orders to [facility] -Patient is not interested in insulin technology at this time, due to physical activity  MEDICATIONS: Decrease Toujeo 20 units daily Continue NovoLog I:C 1:8 TIDQAC Change CF : Novolog (BG-120/35)   EDUCATION / INSTRUCTIONS: BG monitoring instructions: Patient is instructed to check his blood sugars 3 times a day,  before each meal. Call [facility] if: BG persistently < 70  I reviewed the Rule of 15 for the treatment of hypoglycemia in detail with the patient. Literature supplied.   2) Diabetic complications:  Eye: Does not have known diabetic retinopathy.  Neuro/ Feet: Does not have known diabetic peripheral neuropathy. Renal: Patient does not have known baseline CKD. He is not on an ACEI/ARB at present.  3) Lipids: Patient is not on a statin.   -Patient was provided with printed lab orders to take to [facility] as we do not have phlebotomist today  Follow-up 6 months   Signed electronically by: Lyndle Herrlich, MD  [facility] Endocrinology  [facility] [address] [place], [place] [address] Phone: [phone] FAX: [phone]   CC: Milus Height, PA [address] [place] [address] Phone: [phone]  Fax: [phone]    Return to Endocrinology clinic as below: No future appointments.

## 2023-02-23 ENCOUNTER — Encounter: Payer: Self-pay | Admitting: Internal Medicine

## 2023-02-23 LAB — BASIC METABOLIC PANEL
BUN/Creatinine Ratio: 12 (ref 9–20)
BUN: 12 mg/dL (ref 6–20)
CO2: 25 mmol/L (ref 20–29)
Calcium: 9.8 mg/dL (ref 8.7–10.2)
Chloride: 99 mmol/L (ref 96–106)
Creatinine, Ser: 0.97 mg/dL (ref 0.76–1.27)
Glucose: 140 mg/dL — ABNORMAL HIGH (ref 70–99)
Potassium: 4.3 mmol/L (ref 3.5–5.2)
Sodium: 142 mmol/L (ref 134–144)
eGFR: 108 mL/min/{1.73_m2} (ref >59)

## 2023-02-23 LAB — MICROALBUMIN / CREATININE URINE RATIO
Creatinine, Urine: 75.6 mg/dL
Microalb/Creat Ratio: 10 mg/g creat (ref 0–29)
Microalbumin, Urine: 7.3 ug/mL

## 2023-02-23 LAB — LIPID PANEL
Chol/HDL Ratio: 2.4 ratio (ref 0.0–5.0)
Cholesterol, Total: 172 mg/dL (ref 100–199)
HDL: 72 mg/dL (ref >39)
LDL Chol Calc (NIH): 85 mg/dL (ref 0–99)
Triglycerides: 82 mg/dL (ref 0–149)
VLDL Cholesterol Cal: 15 mg/dL (ref 5–40)

## 2023-02-23 LAB — TSH: TSH: 1.25 u[IU]/mL (ref 0.450–4.500)

## 2023-06-24 ENCOUNTER — Telehealth: Payer: Self-pay

## 2023-06-24 ENCOUNTER — Other Ambulatory Visit (HOSPITAL_COMMUNITY): Payer: Self-pay

## 2023-06-24 NOTE — Telephone Encounter (Signed)
Patient picked up 2 Dexcom G7 sensors samples

## 2023-06-24 NOTE — Telephone Encounter (Signed)
Patient came in to office today and picked up 2 samples of Dexcom G7 sensors.

## 2023-06-24 NOTE — Telephone Encounter (Signed)
Patient needs PA on Dexcom, Novolog, and Toujeo.

## 2023-06-27 ENCOUNTER — Other Ambulatory Visit (HOSPITAL_COMMUNITY): Payer: Self-pay

## 2023-06-27 ENCOUNTER — Telehealth: Payer: Self-pay

## 2023-06-27 MED ORDER — INSULIN LISPRO (1 UNIT DIAL) 100 UNIT/ML (KWIKPEN): PEN_INJECTOR | SUBCUTANEOUS | 3 refills | Status: DC

## 2023-06-27 MED ORDER — LANTUS SOLOSTAR 100 UNIT/ML ~~LOC~~ SOPN: 22.0000 [IU] | PEN_INJECTOR | Freq: Every day | SUBCUTANEOUS | 3 refills | Status: DC

## 2023-06-27 NOTE — Telephone Encounter (Signed)
Pharmacy Patient Advocate Encounter   Received notification from Pt Calls Messages that prior authorization for Dexcom G7 sensor is required/requested.   Insurance verification completed.   The patient is insured through [facility] .   Per test claim: PA required; PA submitted to [facility] via CoverMyMeds Key/confirmation #/EOC [id number] Status is pending

## 2023-07-05 NOTE — Telephone Encounter (Signed)
Pharmacy Patient Advocate Encounter  Received notification from [facility] that Prior Authorization for Dexcom G7 sensor has been APPROVED through 06/26/2024   PA #/Case ID/Reference #: [id number]

## 2023-08-25 ENCOUNTER — Ambulatory Visit: Payer: BC Managed Care – PPO | Admitting: Internal Medicine

## 2023-08-25 NOTE — Progress Notes (Deleted)
Name: Bobby Padilla  MRN/ DOB: [id number], 16-Nov-1990   Age/ Sex: 32 y.o., male    PCP: Milus Height, PA   Reason for Endocrinology Evaluation: Type 1 Diabetes Mellitus     Date of Initial Endocrinology Visit: 02/22/2023    PATIENT IDENTIFIER: Bobby Padilla is a 32 y.o. male with a past medical history of DM. The patient presented for initial endocrinology clinic visit on 02/22/2023  for consultative assistance with his diabetes management.    HPI: Mr. Polmanteer was    Diagnosed with DM 2004 He was on the Omnipod years ago  Hemoglobin A1c has ranged from 6.9% in 2017, peaking at 7.9% in 2014.   Patient used to follow-up with Dr. Fransico Michael with peds endocrinology, he subsequently establish care with Dr. Elvera Lennox, he last saw her in 2017 but moved to [place], [place] Patient to establish care with [facility] upon his return back to [facility] 2024  Paternal uncle with T1DM  On his initial visit to our clinic, his A1c was 6.7%, we adjusted MDI regimen.  Patient not interested in insulin pump technology  SUBJECTIVE:    Today (08/25/23):  Bobby Padilla is here for follow-up on diabetes management.  He checks his glucose multiple times daily through Dexcom.  The patient has been noted with hypoglycemia.     HOME DIABETES REGIMEN: Toujeo 20 units daily  NovoLog 1: C 1:8  Targe 120  SF 35    Statin: no ACE-I/ARB: no Prior Diabetic Education: yes   CONTINUOUS GLUCOSE MONITORING RECORD INTERPRETATION    Dates of Recording: 5/8-5/21/204  Sensor description: dexcom  Results statistics:   CGM use % of time 93  Average and SD 163/68  Time in range     56   %  % Time Above 180 27  % Time above 250 11  % Time Below target 3   Glycemic patterns summary: Bg's mostly within  range  Hyperglycemic episodes  postprandial   Hypoglycemic episodes occurred at night and post bolus   Overnight periods: trends down     DIABETIC COMPLICATIONS: Microvascular  complications:   Denies: neuropathy, CKD  Last eye exam: Completed 02/2023  Macrovascular complications:   Denies: CAD, PVD, CVA   PAST HISTORY: Past Medical History:  Past Medical History:  Diagnosis Date   Diabetes mellitus    Past Surgical History: No past surgical history on file.  Social History:  reports that he has never smoked. He has never used smokeless tobacco. No history on file for alcohol use and drug use. Family History:  Family History  Problem Relation Age of Onset   Diabetes Maternal Aunt    Diabetes Maternal Uncle    Heart attack Neg Hx      HOME MEDICATIONS: Allergies as of 08/25/2023   No Known Allergies      Medication List        Accurate as of August 25, 2023  7:00 AM. If you have any questions, ask your nurse or doctor.          Dexcom G7 Sensor Misc 1 each by Does not apply route continuous.   Glucagon Emergency 1 MG Kit Inject 1 mg into the muscle as needed.   glucose blood test strip Commonly known as: OneTouch Verio Use to test blood sugar 6 times daily as instructed. Dx: E10.9   insulin lispro 100 UNIT/ML KwikPen Commonly known as: HumaLOG KwikPen Max daily 45 units   Insulin Pen Needle 32G X  4 MM Misc 1 Device by Does not apply route in the morning, at noon, in the evening, and at bedtime.   Lantus SoloStar 100 UNIT/ML Solostar Pen Generic drug: insulin glargine Inject 22 Units into the skin daily.         ALLERGIES: No Known Allergies   REVIEW OF SYSTEMS: A comprehensive ROS was conducted with the patient and is negative except as per HPI     OBJECTIVE:   VITAL SIGNS: There were no vitals taken for this visit.   PHYSICAL EXAM:  General: Pt appears well and is in NAD  Neck: General: Supple without adenopathy or carotid bruits. Thyroid: Thyroid size normal.  No goiter or nodules appreciated.   Lungs: Clear with good BS bilat   Heart: RRR   Abdomen:  soft, nontender  Extremities:  Lower extremities  - No pretibial edema.   Neuro: MS is good with appropriate affect, pt is alert and Ox3    DM foot exam: 02/22/2023  The skin of the feet is intact without sores or ulcerations. The pedal pulses are 2+ on right and 2+ on left. The sensation is intact to a screening 5.07, 10 gram monofilament bilaterally   DATA REVIEWED:  Lab Results  Component Value Date   HGBA1C 6.7 (A) 02/22/2023   HGBA1C 6.9 05/27/2016   HGBA1C 7.5 11/21/2015    Latest Reference Range & Units 02/22/23 15:01  Sodium 134 - 144 mmol/L 142  Potassium 3.5 - 5.2 mmol/L 4.3  Chloride 96 - 106 mmol/L 99  CO2 20 - 29 mmol/L 25  Glucose 70 - 99 mg/dL 440 (H)  BUN 6 - 20 mg/dL 12  Creatinine 3.47 - 4.25 mg/dL 9.56  Calcium 8.7 - 38.7 mg/dL 9.8  BUN/Creatinine Ratio 9 - 20  12  eGFR >59 mL/min/1.73 108  Total CHOL/HDL Ratio 0.0 - 5.0 ratio 2.4  Cholesterol, Total 100 - 199 mg/dL 564  HDL Cholesterol >33 mg/dL 72  MICROALB/CREAT RATIO  WILL FOLLOW (P)  Triglycerides 0 - 149 mg/dL 82  VLDL Cholesterol Cal 5 - 40 mg/dL 15  LDL Chol Calc (NIH) 0 - 99 mg/dL 85  TSH 2.951 - 8.841 uIU/mL 1.250  Microalbumin, Urine  WILL FOLLOW (P)  Creatinine, Urine  WILL FOLLOW (P)  (H): Data is abnormally high (P): Preliminary   ASSESSMENT / PLAN / RECOMMENDATIONS:   1) Type 1 Diabetes Mellitus, Optimally controlled, Without complications - Most recent A1c of 6.7 %. Goal A1c < 7.0 %.    -Patient with optimal glycemic control -In reviewing his CGM download, he has been noted with hypoglycemia overnight as well as after bolus -I have encouraged the patient to take NovoLog before each meal rather than after the meal -Will reduce basal insulin to prevent overnight hypoglycemia -I will also adjust his sensitivity factor from 30 to 35 , as he has been noted with post bolus hypoglycemia  -We do not have a phlebotomist today, he was printed lab orders to [facility] -Patient is not interested in insulin technology at this time, due to  physical activity  MEDICATIONS: Decrease Toujeo 20 units daily Continue NovoLog I:C 1:8 TIDQAC Change CF : Novolog (BG-120/35)   EDUCATION / INSTRUCTIONS: BG monitoring instructions: Patient is instructed to check his blood sugars 3 times a day, before each meal. Call [facility] if: BG persistently < 70  I reviewed the Rule of 15 for the treatment of hypoglycemia in detail with the patient. Literature supplied.   2) Diabetic complications:  Eye: Does not have known diabetic retinopathy.  Neuro/ Feet: Does not have known diabetic peripheral neuropathy. Renal: Patient does not have known baseline CKD. He is not on an ACEI/ARB at present.  3) Lipids: Patient is not on a statin.   -Patient was provided with printed lab orders to take to [facility] as we do not have phlebotomist today  Follow-up 6 months   Signed electronically by: Lyndle Herrlich, MD  [facility]  [facility] [address] [place] [place], [place] [address] Phone: [phone] FAX: [phone]   CC: Milus Height, PA [address] [place] [address] Phone: [phone]  Fax: [phone]    Return to Endocrinology clinic as below: Future Appointments  Date Time Provider Department Center  08/25/2023  8:30 AM Kasidy Gianino, Konrad Dolores, MD [facility] None

## 2023-12-15 ENCOUNTER — Encounter: Payer: Self-pay | Admitting: Internal Medicine

## 2023-12-15 ENCOUNTER — Ambulatory Visit: Admitting: Internal Medicine

## 2023-12-15 VITALS — BP 120/70 | HR 55 | Ht 74.0 in | Wt 187.0 lb

## 2023-12-15 DIAGNOSIS — E109 Type 1 diabetes mellitus without complications: Secondary | ICD-10-CM | POA: Diagnosis not present

## 2023-12-15 LAB — POCT GLYCOSYLATED HEMOGLOBIN (HGB A1C): Hemoglobin A1C: 6.7 % — AB (ref 4.0–5.6)

## 2023-12-15 NOTE — Progress Notes (Signed)
 Name: Bobby Padilla  MRN/ DOB: [id number], December 02, 1990   Age/ Sex: 33 y.o., male    PCP: Milus Height, PA   Reason for Endocrinology Evaluation: Type 1 Diabetes Mellitus     Date of Initial Endocrinology Visit: 02/22/2023     PATIENT IDENTIFIER: Bobby Padilla is a 33 y.o. male with a past medical history of DM. The patient presented for initial endocrinology clinic visit on 02/22/2023 for consultative assistance with his diabetes management.    HPI: Bobby Padilla was    Diagnosed with DM 2004 He was on the Omnipod years ago  Hemoglobin A1c has ranged from 6.9% in 2017, peaking at 7.9% in 2014.    Patient used to follow-up with Dr. Fransico Michael with peds endocrinology, he subsequently establish care with Dr. Elvera Lennox, he last saw her in 2017 but moved to [place], [place] Patient to establish care with [facility] upon his return back to [facility] 02/2023 and was lost to follow-up until his return again 12/2023  Paternal uncle with T1DM  On his initial visit to our clinic he had an A1c of 6.7%, decrease Toujeo due to hypoglycemia, continue NovoLog.  He was not interested in insulin pump technology due to physical activity.  SUBJECTIVE:   During the last visit (02/22/2023): A1c 6.7%  Today (12/15/23): Bobby Padilla is here for follow-up on diabetes management.  He has NOT been to our clinic in 10 months. He checks blood sugars multiple times daily. The patient has  had hypoglycemic episodes since the last clinic visit. The patient is symptomatic with these episodes.    HOME DIABETES REGIMEN: Lantus/ Toujeo  24 units daily - takes 26 units  Humalog 1: C 1:8  Targe 120  SF 30    Statin: no ACE-I/ARB: no Prior Diabetic Education: yes   CONTINUOUS GLUCOSE MONITORING RECORD INTERPRETATION    Dates of Recording: 2/28-3/13/2025  Sensor description: dexcom  Results statistics:   CGM use % of time 90  Average and SD 186/78  Time in range   52   %  % Time Above 180  26  % Time above 250 21  % Time Below target 1   Glycemic patterns summary: BGs fluctuate throughout the day and night  Hyperglycemic episodes  postprandial   Hypoglycemic episodes occurred at night and post bolus   Overnight periods: trends down     DIABETIC COMPLICATIONS: Microvascular complications:   Denies: neuropathy, CKD  Last eye exam: Completed 02/2023  Macrovascular complications:   Denies: CAD, PVD, CVA   PAST HISTORY: Past Medical History:  Past Medical History:  Diagnosis Date   Diabetes mellitus    Past Surgical History: No past surgical history on file.  Social History:  reports that he has never smoked. He has never used smokeless tobacco. No history on file for alcohol use and drug use. Family History:  Family History  Problem Relation Age of Onset   Diabetes Maternal Aunt    Diabetes Maternal Uncle    Heart attack Neg Hx      HOME MEDICATIONS: Allergies as of 12/15/2023   No Known Allergies      Medication List        Accurate as of December 15, 2023  9:46 AM. If you have any questions, ask your nurse or doctor.          Dexcom G7 Sensor Misc 1 each by Does not apply route continuous.   Glucagon Emergency 1 MG Kit Inject 1 mg  into the muscle as needed.   glucose blood test strip Commonly known as: OneTouch Verio Use to test blood sugar 6 times daily as instructed. Dx: E10.9   insulin lispro 100 UNIT/ML KwikPen Commonly known as: HumaLOG KwikPen Max daily 45 units   Insulin Pen Needle 32G X 4 MM Misc 1 Device by Does not apply route in the morning, at noon, in the evening, and at bedtime.   Lantus SoloStar 100 UNIT/ML Solostar Pen Generic drug: insulin glargine Inject 22 Units into the skin daily.         ALLERGIES: No Known Allergies   REVIEW OF SYSTEMS: A comprehensive ROS was conducted with the patient and is negative except as per HPI    OBJECTIVE:   VITAL SIGNS: BP 120/70 (BP Location: Left Arm, Patient  Position: Sitting, Cuff Size: Small)   Pulse (!) 55   Ht 6\' 2"  (1.88 m)   Wt 187 lb (84.8 kg)   SpO2 98%   BMI 24.01 kg/m    PHYSICAL EXAM:  General: Pt appears well and is in NAD  Neck: General: Supple without adenopathy or carotid bruits. Thyroid: Thyroid size normal.  No goiter or nodules appreciated.   Lungs: Clear with good BS bilat   Heart: RRR   Abdomen:  soft, nontender  Extremities:  Lower extremities - No pretibial edema.   Neuro: MS is good with appropriate affect, pt is alert and Ox3    DM foot exam: 12/15/2023  The skin of the feet is intact without sores or ulcerations. The pedal pulses are 2+ on right and 2+ on left. The sensation is intact to a screening 5.07, 10 gram monofilament bilaterally   DATA REVIEWED:  Lab Results  Component Value Date   HGBA1C 6.7 (A) 12/15/2023   HGBA1C 6.7 (A) 02/22/2023   HGBA1C 6.9 05/27/2016    Latest Reference Range & Units 02/22/23 15:01  Sodium 134 - 144 mmol/L 142  Potassium 3.5 - 5.2 mmol/L 4.3  Chloride 96 - 106 mmol/L 99  CO2 20 - 29 mmol/L 25  Glucose 70 - 99 mg/dL 161 (H)  BUN 6 - 20 mg/dL 12  Creatinine 0.96 - 0.45 mg/dL 4.09  Calcium 8.7 - 81.1 mg/dL 9.8  BUN/Creatinine Ratio 9 - 20  12  eGFR >59 mL/min/1.73 108  Total CHOL/HDL Ratio 0.0 - 5.0 ratio 2.4  Cholesterol, Total 100 - 199 mg/dL 914  HDL Cholesterol >78 mg/dL 72  MICROALB/CREAT RATIO  WILL FOLLOW (P)  Triglycerides 0 - 149 mg/dL 82  VLDL Cholesterol Cal 5 - 40 mg/dL 15  LDL Chol Calc (NIH) 0 - 99 mg/dL 85  TSH 2.956 - 2.130 uIU/mL 1.250  Microalbumin, Urine  WILL FOLLOW (P)  Creatinine, Urine  WILL FOLLOW (P)  (H): Data is abnormally high (P): Preliminary   ASSESSMENT / PLAN / RECOMMENDATIONS:   1) Type 1 Diabetes Mellitus, Optimally controlled, Without complications - Most recent A1c of 6.7 %. Goal A1c < 7.0 %.    -Patient with optimal glycemic control -In reviewing his CGM download, patient has been noted with hypo and  hyperglycemia. -I have recommended decreasing basal insulin again today -Part of his hypo and hyperglycemia during the day is due to insulin-carbohydrate mismatch -Patient interested in insulin pump technology, we briefly discussed OmniPod, tandem, and Mobi.  Patient interested in tandem, he was given contact information to start the process  MEDICATIONS: Decrease Toujeo 26 units daily Continue Humalog  I:C 1:8 TIDQAC Continue  CF : Novolog (  BG-120/35)   EDUCATION / INSTRUCTIONS: BG monitoring instructions: Patient is instructed to check his blood sugars 3 times a day, before each meal. Call [facility] if: BG persistently < 70  I reviewed the Rule of 15 for the treatment of hypoglycemia in detail with the patient. Literature supplied.   2) Diabetic complications:  Eye: Does not have known diabetic retinopathy.  Neuro/ Feet: Does not have known diabetic peripheral neuropathy. Renal: Patient does not have known baseline CKD. He is not on an ACEI/ARB at present.    Follow-up 6 months  I spent 30 minutes preparing to see the patient by review of recent labs, imaging and procedures, obtaining and reviewing separately obtained history, communicating with the patient ordering medications, tests or procedures, and documenting clinical information in the EHR including the differential Dx, treatment, and any further evaluation and other management   Signed electronically by: Lyndle Herrlich, MD  [facility]  [facility] [address] [place] [place], [place] [address] Phone: [phone] FAX: [phone]   CC: Milus Height, PA [address] [place] [address] Phone: [phone]  Fax: [phone]    Return to Endocrinology clinic as below: Future Appointments  Date Time Provider Department Center  06/18/2024  8:30 AM Marytza Grandpre, Konrad Dolores, MD [facility] None

## 2023-12-15 NOTE — Patient Instructions (Addendum)
 Toujeo/Lantus  24 units daily  Humalog 1:8 with each meal  Humalog correctional insulin: ADD extra units on insulin to your meal-time Novolog dose if your blood sugars are higher than 155. Use the scale below to help guide you:   Blood sugar before meal Number of units to inject  Less than 155 0 unit  156 -  190 1 units  191 -  225 2 units  226 -  260 3 units  261 -  295 4 units  296 -  330 5 units    HOW TO TREAT LOW BLOOD SUGARS (Blood sugar LESS THAN 70 MG/DL) Please follow the RULE OF 15 for the treatment of hypoglycemia treatment (when your (blood sugars are less than 70 mg/dL)   STEP 1: Take 15 grams of carbohydrates when your blood sugar is low, which includes:  3-4 GLUCOSE TABS  OR 3-4 OZ OF JUICE OR REGULAR SODA OR ONE TUBE OF GLUCOSE GEL    STEP 2: RECHECK blood sugar in 15 MINUTES STEP 3: If your blood sugar is still low at the 15 minute recheck --> then, go back to STEP 1 and treat AGAIN with another 15 grams of carbohydrates.

## 2024-05-11 ENCOUNTER — Other Ambulatory Visit: Payer: Self-pay | Admitting: Internal Medicine

## 2024-06-18 ENCOUNTER — Encounter: Payer: Self-pay | Admitting: Internal Medicine

## 2024-06-18 ENCOUNTER — Ambulatory Visit: Admitting: Internal Medicine

## 2024-06-18 VITALS — BP 120/80 | HR 63 | Ht 74.0 in | Wt 186.6 lb

## 2024-06-18 DIAGNOSIS — E109 Type 1 diabetes mellitus without complications: Secondary | ICD-10-CM

## 2024-06-18 LAB — POCT GLYCOSYLATED HEMOGLOBIN (HGB A1C): Hemoglobin A1C: 7 % — AB (ref 4.0–5.6)

## 2024-06-18 MED ORDER — INSULIN PEN NEEDLE 32G X 4 MM MISC: 1.0000 | Freq: Four times a day (QID) | 3 refills | Status: AC

## 2024-06-18 MED ORDER — LANTUS SOLOSTAR 100 UNIT/ML ~~LOC~~ SOPN: 24.0000 [IU] | PEN_INJECTOR | Freq: Every day | SUBCUTANEOUS | 3 refills | Status: DC

## 2024-06-18 MED ORDER — INSULIN LISPRO (1 UNIT DIAL) 100 UNIT/ML (KWIKPEN): PEN_INJECTOR | SUBCUTANEOUS | 3 refills | Status: DC

## 2024-06-18 NOTE — Patient Instructions (Addendum)
 Lantus   24 units daily  Humalog  1:10 with breakfast, 1:6 with lunch and supper Humalog  correctional insulin : ADD extra units on insulin  to your meal-time Novolog  dose if your blood sugars are higher than 155. Use the scale below to help guide you:   Blood sugar before meal Number of units to inject  Less than 155 0 unit  156 -  190 1 units  191 -  225 2 units  226 -  260 3 units  261 -  295 4 units  296 -  330 5 units    HOW TO TREAT LOW BLOOD SUGARS (Blood sugar LESS THAN 70 MG/DL) Please follow the RULE OF 15 for the treatment of hypoglycemia treatment (when your (blood sugars are less than 70 mg/dL)   STEP 1: Take 15 grams of carbohydrates when your blood sugar is low, which includes:  3-4 GLUCOSE TABS  OR 3-4 OZ OF JUICE OR REGULAR SODA OR ONE TUBE OF GLUCOSE GEL    STEP 2: RECHECK blood sugar in 15 MINUTES STEP 3: If your blood sugar is still low at the 15 minute recheck --> then, go back to STEP 1 and treat AGAIN with another 15 grams of carbohydrates.

## 2024-06-18 NOTE — Progress Notes (Signed)
 Name: Bobby Padilla  MRN/ DOB: [id number], 01-13-1991   Age/ Sex: 33 y.o., male    PCP: Patient, No Pcp Per   Reason for Endocrinology Evaluation: Type 1 Diabetes Mellitus     Date of Initial Endocrinology Visit: 02/22/2023     PATIENT IDENTIFIER: Bobby Padilla is a 33 y.o. male with a past medical history of DM. The patient presented for initial endocrinology clinic visit on 02/22/2023 for consultative assistance with his diabetes management.    HPI: Bobby Padilla was    Diagnosed with DM 2004 He was on the Omnipod years ago  Hemoglobin A1c has ranged from 6.9% in 2017, peaking at 7.9% in 2014.    Patient used to follow-up with Dr. Hershal with peds endocrinology, he subsequently establish care with Dr. Trixie, he last saw her in 2017 but moved to [place], [place] Patient to establish care with [facility] Endocrinology upon his return back to [facility] 02/2023 and was lost to follow-up until his return again 12/2023  Paternal uncle with T1DM  On his initial visit to our clinic he had an A1c of 6.7%, decrease Toujeo  due to hypoglycemia, continue NovoLog .  He was not interested in insulin  pump technology due to physical activity.  SUBJECTIVE:   During the last visit (12/15/2023): A1c 6.7%  Today (06/18/24): Bobby Padilla is here for follow-up on diabetes management.  He checks blood sugars multiple times daily through CGM. The patient has  had hypoglycemic episodes since the last clinic visit. The patient is symptomatic with these episodes.  No recent nausea vomiting Nausea constipation or diarrhea The patient does endorse an predictability with taking prandial insulin  as at times his BGs improved and other times (like yesterday) his BG's remained elevated despite taking 4 prandial doses of insulin   HOME DIABETES REGIMEN: Lantus   24 units daily Humalog  1: C 1:8  Targe 120  SF 30    Statin: no ACE-I/ARB: no Prior Diabetic Education: yes   CONTINUOUS GLUCOSE MONITORING  RECORD INTERPRETATION    Dates of Recording:9/2-9/15/2025  Sensor description: dexcom  Results statistics:   CGM use % of time 94  Average and SD 197/82  Time in range 45 %  % Time Above 180 28  % Time above 250 25  % Time Below target 2   Glycemic patterns summary: BGs trend down to optimal overnight and fluctuate during the day Hyperglycemic episodes  postprandial   Hypoglycemic episodes occurred after bolus  Overnight periods: trends down to optimal    DIABETIC COMPLICATIONS: Microvascular complications:   Denies: neuropathy, CKD  Last eye exam: Completed 02/2023  Macrovascular complications:   Denies: CAD, PVD, CVA   PAST HISTORY: Past Medical History:  Past Medical History:  Diagnosis Date   Diabetes mellitus    Past Surgical History: No past surgical history on file.  Social History:  reports that he has never smoked. He has never used smokeless tobacco. No history on file for alcohol use and drug use. Family History:  Family History  Problem Relation Age of Onset   Diabetes Maternal Aunt    Diabetes Maternal Uncle    Heart attack Neg Hx      HOME MEDICATIONS: Allergies as of 06/18/2024   No Known Allergies      Medication List        Accurate as of June 18, 2024  8:44 AM. If you have any questions, ask your nurse or doctor.          Dexcom  G7 Sensor Misc INJECT 1 SENSOR AND REPLACE EVERY 10 DAYS AS DIRECTED   Glucagon  Emergency 1 MG Kit Inject 1 mg into the muscle as needed.   glucose blood test strip Commonly known as: OneTouch Verio Use to test blood sugar 6 times daily as instructed. Dx: E10.9   insulin  lispro 100 UNIT/ML KwikPen Commonly known as: HumaLOG  KwikPen Max daily 45 units   Insulin  Pen Needle 32G X 4 MM Misc 1 Device by Does not apply route in the morning, at noon, in the evening, and at bedtime.   Lantus  SoloStar 100 UNIT/ML Solostar Pen Generic drug: insulin  glargine Inject 22 Units into the skin daily.          ALLERGIES: No Known Allergies   REVIEW OF SYSTEMS: A comprehensive ROS was conducted with the patient and is negative except as per HPI    OBJECTIVE:   VITAL SIGNS: BP 120/80 (BP Location: Left Arm, Patient Position: Sitting, Cuff Size: Normal)   Pulse 63   Ht 6' 2 (1.88 m)   Wt 186 lb 9.6 oz (84.6 kg)   SpO2 99%   BMI 23.96 kg/m    PHYSICAL EXAM:  General: Pt appears well and is in NAD  Neck: General: Supple without adenopathy or carotid bruits. Thyroid : Thyroid  size normal.  No goiter or nodules appreciated.   Lungs: Clear with good BS bilat   Heart: RRR   Abdomen:  soft, nontender  Extremities:  Lower extremities - No pretibial edema.   Neuro: MS is good with appropriate affect, pt is alert and Ox3    DM foot exam: 12/15/2023  The skin of the feet is intact without sores or ulcerations. The pedal pulses are 2+ on right and 2+ on left. The sensation is intact to a screening 5.07, 10 gram monofilament bilaterally   DATA REVIEWED:  Lab Results  Component Value Date   HGBA1C 6.7 (A) 12/15/2023   HGBA1C 6.7 (A) 02/22/2023   HGBA1C 6.9 05/27/2016    Latest Reference Range & Units 02/22/23 15:01  Sodium 134 - 144 mmol/L 142  Potassium 3.5 - 5.2 mmol/L 4.3  Chloride 96 - 106 mmol/L 99  CO2 20 - 29 mmol/L 25  Glucose 70 - 99 mg/dL 859 (H)  BUN 6 - 20 mg/dL 12  Creatinine 9.23 - 8.72 mg/dL 9.02  Calcium 8.7 - 89.7 mg/dL 9.8  BUN/Creatinine Ratio 9 - 20  12  eGFR >59 mL/min/1.73 108  Total CHOL/HDL Ratio 0.0 - 5.0 ratio 2.4  Cholesterol, Total 100 - 199 mg/dL 827  HDL Cholesterol >60 mg/dL 72  MICROALB/CREAT RATIO  WILL FOLLOW (P)  Triglycerides 0 - 149 mg/dL 82  VLDL Cholesterol Cal 5 - 40 mg/dL 15  LDL Chol Calc (NIH) 0 - 99 mg/dL 85  TSH 9.549 - 5.499 uIU/mL 1.250  Microalbumin, Urine  WILL FOLLOW (P)  Creatinine, Urine  WILL FOLLOW (P)  (H): Data is abnormally high (P): Preliminary   ASSESSMENT / PLAN / RECOMMENDATIONS:   1) Type 1  Diabetes Mellitus, Optimally controlled, Without complications - Most recent A1c of 7.0 %. Goal A1c < 7.0 %.    -A1c is optimal but the patient continues with glycemic excursions  -Patient indicates prandial insulin  being unpredictable and sometimes it works and at times he continues with persistent hyperglycemia.  For example yesterday, he gave himself 4 different Humalog  injection and his BG remained consistent 400 mg/DL, the patient ended up with hypoglycemia and nighttime, I did advised the patient to  start injecting insulin  at different sites other than lower abdomen - Will proceed with tandem pump prescription - I will also adjust his insulin  to carb ratio as below - Patient declined blood work today, but he was in agreement of proceeding with MA/CR ratio, which is pending today  MEDICATIONS: Continue Lantus  24 units daily Change Humalog   I:C 1:10 for breakfast,1:6 for lunch and supper Continue  CF : Novolog  (BG-120/35)   EDUCATION / INSTRUCTIONS: BG monitoring instructions: Patient is instructed to check his blood sugars 3 times a day, before each meal. Call  Endocrinology clinic if: BG persistently < 70  I reviewed the Rule of 15 for the treatment of hypoglycemia in detail with the patient. Literature supplied.   2) Diabetic complications:  Eye: Does not have known diabetic retinopathy.  Neuro/ Feet: Does not have known diabetic peripheral neuropathy. Renal: Patient does not have known baseline CKD. He is not on an ACEI/ARB at present.    Follow-up 6 months   Signed electronically by: Stefano Redgie Butts, MD  [facility] Endocrinology  [facility] [address] [place], [place] [address] Phone: [phone] FAX: [phone]   CC: Patient, No Pcp Per No address on file Phone: None  Fax: None    Return to Endocrinology clinic as below: No future appointments.

## 2024-06-19 ENCOUNTER — Ambulatory Visit: Payer: Self-pay | Admitting: Internal Medicine

## 2024-06-19 LAB — MICROALBUMIN / CREATININE URINE RATIO
Creatinine, Urine: 202 mg/dL (ref 20–320)
Microalb Creat Ratio: 10 mg/g{creat} (ref <30)
Microalb, Ur: 2 mg/dL

## 2024-07-06 ENCOUNTER — Other Ambulatory Visit: Payer: Self-pay | Admitting: Internal Medicine

## 2024-07-06 MED ORDER — HUMALOG 100 UNIT/ML ~~LOC~~ SOCT: SUBCUTANEOUS | 3 refills | Status: DC

## 2024-08-03 ENCOUNTER — Encounter: Payer: Self-pay | Admitting: Internal Medicine

## 2024-08-03 MED ORDER — INSULIN LISPRO 100 UNIT/ML IJ SOLN: INTRAMUSCULAR | 3 refills | Status: AC

## 2024-12-17 ENCOUNTER — Ambulatory Visit: Admitting: Internal Medicine
